# Patient Record
Sex: Female | Born: 1950 | Hispanic: Yes | Smoking: Never smoker
Health system: Southern US, Community
[De-identification: ages and names within clinical notes are randomized; demographics above are authoritative.]

## PROBLEM LIST (undated history)

## (undated) DIAGNOSIS — E039 Hypothyroidism, unspecified: Secondary | ICD-10-CM

## (undated) DIAGNOSIS — Z800 Family history of malignant neoplasm of digestive organs: Secondary | ICD-10-CM

## (undated) DIAGNOSIS — E079 Disorder of thyroid, unspecified: Secondary | ICD-10-CM

## (undated) DIAGNOSIS — M8580 Other specified disorders of bone density and structure, unspecified site: Secondary | ICD-10-CM

## (undated) DIAGNOSIS — R32 Unspecified urinary incontinence: Secondary | ICD-10-CM

## (undated) HISTORY — DX: Family history of malignant neoplasm of digestive organs: Z80.0

## (undated) HISTORY — PX: CATARACT EXTRACTION: SUR2

## (undated) HISTORY — DX: Other specified disorders of bone density and structure, unspecified site: M85.80

## (undated) HISTORY — DX: Unspecified urinary incontinence: R32

## (undated) HISTORY — PX: THORACIC OUTLET SURGERY: SHX2502

## (undated) HISTORY — DX: Disorder of thyroid, unspecified: E07.9

---

## 1999-08-03 ENCOUNTER — Other Ambulatory Visit: Admission: RE | Admit: 1999-08-03 | Discharge: 1999-08-03 | Payer: Self-pay | Admitting: Obstetrics and Gynecology

## 2000-10-18 ENCOUNTER — Other Ambulatory Visit: Admission: RE | Admit: 2000-10-18 | Discharge: 2000-10-18 | Payer: Self-pay | Admitting: Obstetrics and Gynecology

## 2000-10-19 ENCOUNTER — Other Ambulatory Visit: Admission: RE | Admit: 2000-10-19 | Discharge: 2000-10-19 | Payer: Self-pay | Admitting: Obstetrics and Gynecology

## 2000-10-19 ENCOUNTER — Encounter (INDEPENDENT_AMBULATORY_CARE_PROVIDER_SITE_OTHER): Payer: Self-pay

## 2001-11-05 ENCOUNTER — Other Ambulatory Visit: Admission: RE | Admit: 2001-11-05 | Discharge: 2001-11-05 | Payer: Self-pay | Admitting: Obstetrics and Gynecology

## 2003-03-14 ENCOUNTER — Other Ambulatory Visit: Admission: RE | Admit: 2003-03-14 | Discharge: 2003-03-14 | Payer: Self-pay | Admitting: Obstetrics and Gynecology

## 2004-03-25 ENCOUNTER — Other Ambulatory Visit: Admission: RE | Admit: 2004-03-25 | Discharge: 2004-03-25 | Payer: Self-pay | Admitting: Obstetrics and Gynecology

## 2005-04-11 ENCOUNTER — Other Ambulatory Visit: Admission: RE | Admit: 2005-04-11 | Discharge: 2005-04-11 | Payer: Self-pay | Admitting: Obstetrics and Gynecology

## 2007-12-10 ENCOUNTER — Encounter: Admission: RE | Admit: 2007-12-10 | Discharge: 2007-12-10 | Payer: Self-pay | Admitting: Geriatric Medicine

## 2008-01-17 ENCOUNTER — Encounter: Admission: RE | Admit: 2008-01-17 | Discharge: 2008-02-08 | Payer: Self-pay | Admitting: Geriatric Medicine

## 2008-09-16 ENCOUNTER — Encounter: Admission: RE | Admit: 2008-09-16 | Discharge: 2008-09-16 | Payer: Self-pay | Admitting: Geriatric Medicine

## 2014-02-05 ENCOUNTER — Ambulatory Visit
Admission: RE | Admit: 2014-02-05 | Discharge: 2014-02-05 | Disposition: A | Discharge status: Home / Self Care | Payer: BC Managed Care – PPO | Source: Ambulatory Visit | Attending: Chiropractic Medicine | Admitting: Chiropractic Medicine

## 2014-02-05 ENCOUNTER — Other Ambulatory Visit: Payer: Self-pay | Admitting: Chiropractic Medicine

## 2014-02-05 DIAGNOSIS — M545 Low back pain, unspecified: Secondary | ICD-10-CM

## 2014-04-24 ENCOUNTER — Other Ambulatory Visit: Payer: Self-pay | Admitting: Obstetrics and Gynecology

## 2014-05-13 ENCOUNTER — Telehealth: Payer: Self-pay | Admitting: Obstetrics and Gynecology

## 2014-05-13 NOTE — Telephone Encounter (Signed)
Call from [place] at [facility] and Dr Harrington Challenger is available for this date and time to assist. Patient scheduled to see Dr Quincy Simmonds on 05-30-14 and surgery to be scheduled after appointment.  Routing to provider for final review. Patient agreeable to disposition. Will close encounter

## 2014-05-13 NOTE — Telephone Encounter (Signed)
Patient calling to request we coordinate with Dr. Harrington Challenger and schedule surgery prior to her ngyn appointment with Dr. Quincy Simmonds 05/30/14. 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 Referral notes are in [facility] office.

## 2014-05-13 NOTE — Telephone Encounter (Signed)
I agree to have our office call Dr. Harrington Challenger to see about his availability for surgery assisting for his patient.   Thank you!

## 2014-05-13 NOTE — Telephone Encounter (Signed)
Please make a note in the surgery book to advise Dr. Harrington Challenger of the start time for the case after we confirm the procedure and date following the patient's visit with me.

## 2014-05-13 NOTE — Telephone Encounter (Signed)
LM on Dr Harrington Challenger assistant VM to check on availability for surgery on 07-01-14. LMTCB.

## 2014-05-13 NOTE — Telephone Encounter (Signed)
Call to patient, advised that Dr Harrington Challenger and Dr Quincy Simmonds have discussed her case and we are "holding" 07-01-14 as a potential surgery date for patient. We will have to check with Dr Harrington Challenger on availability for this date.All pending her appointment with Dr Quincy Simmonds on 07-14-20 and she is certainly not obligated to the surgery or date. Patient very pleased.  Do you want me to call to Dr Harrington Challenger to se if this date will work for him?

## 2014-05-21 ENCOUNTER — Encounter (HOSPITAL_COMMUNITY): Admission: RE | Payer: Self-pay | Source: Ambulatory Visit

## 2014-05-21 ENCOUNTER — Ambulatory Visit (HOSPITAL_COMMUNITY)
Admission: RE | Admit: 2014-05-21 | Payer: BC Managed Care – PPO | Source: Ambulatory Visit | Admitting: Obstetrics and Gynecology

## 2014-05-21 SURGERY — HYSTERECTOMY, VAGINAL
Anesthesia: Choice

## 2014-05-30 ENCOUNTER — Encounter: Payer: Self-pay | Admitting: Obstetrics and Gynecology

## 2014-05-30 ENCOUNTER — Ambulatory Visit (INDEPENDENT_AMBULATORY_CARE_PROVIDER_SITE_OTHER): Payer: BC Managed Care – PPO | Admitting: Obstetrics and Gynecology

## 2014-05-30 VITALS — BP 110/62 | HR 60 | Resp 16 | Ht 62.75 in | Wt 125.4 lb

## 2014-05-30 DIAGNOSIS — N814 Uterovaginal prolapse, unspecified: Secondary | ICD-10-CM

## 2014-05-30 DIAGNOSIS — N393 Stress incontinence (female) (male): Secondary | ICD-10-CM

## 2014-05-30 DIAGNOSIS — N812 Incomplete uterovaginal prolapse: Secondary | ICD-10-CM

## 2014-05-30 LAB — POCT URINALYSIS DIPSTICK
Bilirubin, UA: NEGATIVE
Blood, UA: NEGATIVE
Glucose, UA: NEGATIVE
Ketones, UA: NEGATIVE
LEUKOCYTES UA: NEGATIVE
Nitrite, UA: NEGATIVE
Protein, UA: NEGATIVE
Urobilinogen, UA: NEGATIVE
pH, UA: 5

## 2014-05-30 NOTE — Progress Notes (Signed)
Patient ID: Crystal Carr, female   DOB: [birth year], 63 y.o.   MRN: [id number] GYNECOLOGY VISIT  PCP:  Lajean Manes, MD  Referring provider:  Gus Height, MD  HPI: 63 y.o.   Single  Caucasian  female   G2P2002 with Patient's last menstrual period was 02/09/2002.   here for  Uterine prolapse.  Progressive for 10 years.  Feels soreness.  No vaginal bleeding.  Notes bladder has dropped.  Some leakage if bladder if sneezes and coughs.  No leakage if exercises. Some difficulty to finish bowel movements. Needs to evacuate twice occasionally.  No fecal soiling.  Tried a donut pessary, but this did not work well.   No urodynamic testing.   Using Estrace 1 grams pv vagina 2 times a week.   Urine:  Neg  GYNECOLOGIC HISTORY: Patient's last menstrual period was 02/09/2002. Sexually active:  no Partner preference: female Contraception: postmenopausal   Menopausal hormone therapy: Estrace vaginal cream DES exposure:   no Blood transfusions:  no  Sexually transmitted diseases: no   GYN procedures and prior surgeries:  no Last mammogram:  03/2014 [facility]               Last pap and high risk HPV testing:  03/2014 WUJ:WJXBJY of HPV testing  History of abnormal pap smear: 2008 hx of dysplasia and colposcopy but no treatment to cervix.  Repeat pap normal.    OB History   Grav Para Term Preterm Abortions TAB SAB Ect Mult Living   2 2 2       2        LIFESTYLE: Exercise:  Aerobics and weight bearing         Tobacco:  no Alcohol:    1 drink per week Drug use:  no  There are no active problems to display for this patient.   Past Medical History  Diagnosis Date  . Osteopenia   . Thyroid disease     Hyperthyroid--had iodine therapy  . Urinary incontinence     with sneezing, coughing    Past Surgical History  Procedure Laterality Date  . Cataract extraction Bilateral   . Thoracic outlet surgery      Current Outpatient Prescriptions  Medication Sig Dispense  Refill  . calcium carbonate 1250 MG capsule Take 1,250 mg by mouth 2 (two) times daily with a meal.      . cetirizine (ZYRTEC) 10 MG tablet Take 10 mg by mouth as needed for allergies.      . Cholecalciferol (VITAMIN D-3) 1000 UNITS CAPS Take 1,000 Units by mouth daily.      . Fish Oil-Cholecalciferol (FISH OIL + D3) 1000-1000 MG-UNIT CAPS Take 1,000 mg by mouth daily.      Marland Kitchen desonide (DESOWEN) 0.05 % ointment Apply 1 application topically as needed.      Marland Kitchen ELIDEL 1 % cream Apply 1 application topically as needed.      Marland Kitchen ESTRACE VAGINAL 0.1 MG/GM vaginal cream Place 0.1 g vaginally 2 (two) times a week.      . levothyroxine (SYNTHROID, LEVOTHROID) 25 MCG tablet Take 25 mcg by mouth daily.      Marland Kitchen VANOS 0.1 % CREA Apply 1 application topically as needed.       No current facility-administered medications for this visit.     ALLERGIES: Cefzil  Family History  Problem Relation Age of Onset  . Pancreatic cancer Mother     History   Social History  . Marital Status: Single  Spouse Name: N/A    Number of Children: N/A  . Years of Education: N/A   Occupational History  . Not on file.   Social History Main Topics  . Smoking status: Never Smoker   . Smokeless tobacco: Not on file  . Alcohol Use: 0.5 oz/week    1 drink(s) per week  . Drug Use: No  . Sexual Activity: Not Currently    Partners: Male    Birth Control/ Protection: Post-menopausal   Other Topics Concern  . Not on file   Social History Narrative  . No narrative on file    ROS:  Pertinent items are noted in HPI.  PHYSICAL EXAMINATION:    BP 110/62  Pulse 60  Resp 16  Ht 5' 2.75" (1.594 m)  Wt 125 lb 6.4 oz (56.881 kg)  BMI 22.39 kg/m2  LMP 02/09/2002   Wt Readings from Last 3 Encounters:  05/30/14 125 lb 6.4 oz (56.881 kg)     Ht Readings from Last 3 Encounters:  05/30/14 5' 2.75" (1.594 m)    General appearance: alert, cooperative and appears stated age Head: Normocephalic, without obvious  abnormality, atraumatic Neck: no adenopathy, supple, symmetrical, trachea midline and thyroid not enlarged, symmetric, no tenderness/mass/nodules Lungs: clear to auscultation bilaterally. Heart: regular rate and rhythm Abdomen: soft, non-tender; no masses,  no organomegaly Extremities: extremities normal, atraumatic, no cyanosis or edema Skin: Skin color, texture, turgor normal. No rashes or lesions Lymph nodes: Cervical, supraclavicular, and axillary nodes normal. No abnormal inguinal nodes palpated Neurologic: Grossly normal  Pelvic: External genitalia:  no lesions              Urethra:  normal appearing urethra with no masses, tenderness or lesions              Bartholins and Skenes: normal                 Vagina: normal appearing vagina with normal color and discharge, no lesions, second degree cystocele, first degree uterine prolapse, almost second degree rectocele.              Cervix: normal appearance                 Bimanual Exam:  Uterus:  uterus is normal size, shape, consistency and nontender                                      Adnexa: normal adnexa in size, nontender and no masses                                      Rectovaginal: Confirms                                      Anus:  normal sphincter tone, no lesions  ASSESSMENT  Incomplete uterovaginal prolapse. Genuine stress incontinence.   PLAN  I have had a comprehensive discussion with the patient regarding prolapse and urinary incontinence.  I have provided reading materials from [employer]COG regarding prolapse and incontinence in general as well as medical and surgical treatment for these conditions. Medical treatments may include physical therapy and pessary use.  We discussed multiple routes of approach to surgery including: - abdominal hysterectomy with abdominal sacrocolpopexy  with permanent graft with an anterior and posterior colporrhaphy and TVT Exact mdurethral sling. - vaginal approach with vaginal hysterectomy  and anterior and posterior colporrhaphy with possible sacrospinous fixation using native tissue repair or augmentation with bovine or porcine graft, and TVT midurethral sling and cystoscopy.   We discussed benefits and risks of surgery which include but are not limited to bleeding, infection, damage to surrounding organs, ureteral damage, vaginal pain with intercourse, mesh erosion and exposure, dyspareunia, urinary retention and need for prolonged catheterization and/or self catheterization, reoperation, recurrence of prolapse and incontinence,  DVT, PE, death, and reaction to anesthesia.    I have discussed surgical expectations regarding the procedures and success rates, outcomes, and recovery.     With the patient's current level of prolapse and desire for a minimally invasive approach, I would favor a TVH with anterior and posterior colporrhaphy and TVT midurethral sling/cystoscopy.   She will need to complete urodynamic testing with a pessary to reduce prolapse.   An After Visit Summary was printed and given to the patient.  60 minutes of face to face time of which over 50% was spent in counseling.

## 2014-06-01 DIAGNOSIS — N393 Stress incontinence (female) (male): Secondary | ICD-10-CM | POA: Insufficient documentation

## 2014-06-01 DIAGNOSIS — N812 Incomplete uterovaginal prolapse: Secondary | ICD-10-CM | POA: Insufficient documentation

## 2014-06-03 ENCOUNTER — Telehealth: Payer: Self-pay | Admitting: Obstetrics and Gynecology

## 2014-06-03 NOTE — Telephone Encounter (Signed)
Routing to [place] for Urodynamics scheduling.

## 2014-06-03 NOTE — Telephone Encounter (Signed)
Patient is calling to schedule her urodynamics.

## 2014-06-09 ENCOUNTER — Telehealth: Payer: Self-pay | Admitting: Emergency Medicine

## 2014-06-09 NOTE — Telephone Encounter (Signed)
Spoke with patient and instructions for procedure given.  She is scheduled for UA on 06/12/14, Urodynamics on 06/18/14. Patient not currently on bladder control medications.   Dr. Quincy Simmonds,  For scheduling follow up, how would you like to proceed? Urodynamics on 7/8 and surgery with you on 07/01/14.

## 2014-06-09 NOTE — Telephone Encounter (Signed)
Message left to return call to [place] at [phone].   Needs Urodynamics instructions.  Needs follow up with Dr. Quincy Simmonds scheduled.

## 2014-06-09 NOTE — Telephone Encounter (Signed)
If I have an opening in my schedule to perform a consultation on 06/18/14, I will do that. If the schedule does not allow, I will do the visit on 06/30/14.  Thanks!

## 2014-06-12 ENCOUNTER — Ambulatory Visit: Payer: BC Managed Care – PPO

## 2014-06-12 ENCOUNTER — Ambulatory Visit (INDEPENDENT_AMBULATORY_CARE_PROVIDER_SITE_OTHER): Payer: BC Managed Care – PPO | Admitting: *Deleted

## 2014-06-12 VITALS — BP 98/58 | Resp 14 | Ht 62.75 in | Wt 127.0 lb

## 2014-06-12 DIAGNOSIS — N393 Stress incontinence (female) (male): Secondary | ICD-10-CM

## 2014-06-12 LAB — POCT URINALYSIS DIPSTICK
Leukocytes, UA: NEGATIVE
Urobilinogen, UA: NEGATIVE
pH, UA: 5

## 2014-06-12 NOTE — Progress Notes (Signed)
Patient is here for urinalysis of urine for Urodynamics which is scheduled for 06/18/14 with Dr. Quincy Simmonds.  Urine specimen obtained and dipped for urinalysis  Result: Negative  Patient aware that she is good to go for appointment Wednesday.   Routed to provider for review, encounter closed.

## 2014-06-12 NOTE — Telephone Encounter (Signed)
Patient here in office today to complete UA prior to urodynamics. Scheduled at time of making appointment for urodynamics by [place].  UA results in Epic.  Patient is scheduled for follow up urodynamics consult with Dr. Quincy Simmonds for 7/8 at 1345. She is agreeable to this. This time per Lamont Snowball, RN.  Routing to provider for final review.   Will close encounter.

## 2014-06-16 NOTE — Telephone Encounter (Signed)
Spoke with patient. Advised that patient liability under new coverage would be PR: $5830.94. Payment will be due 07/07. Patient agreeable.  Patient will make payment at pre-op visit 07/08.

## 2014-06-17 ENCOUNTER — Encounter (HOSPITAL_COMMUNITY): Payer: Self-pay | Admitting: Pharmacist

## 2014-06-17 NOTE — Progress Notes (Signed)
Encounter reviewed by Dr. Josalin Carneiro Silva.  

## 2014-06-18 ENCOUNTER — Ambulatory Visit (INDEPENDENT_AMBULATORY_CARE_PROVIDER_SITE_OTHER): Payer: BC Managed Care – PPO | Admitting: Obstetrics and Gynecology

## 2014-06-18 ENCOUNTER — Encounter: Payer: Self-pay | Admitting: Obstetrics and Gynecology

## 2014-06-18 VITALS — BP 116/68 | HR 68 | Ht 62.5 in | Wt 125.0 lb

## 2014-06-18 VITALS — BP 108/60 | HR 68 | Ht 62.5 in | Wt 125.0 lb

## 2014-06-18 DIAGNOSIS — N812 Incomplete uterovaginal prolapse: Secondary | ICD-10-CM

## 2014-06-18 DIAGNOSIS — N393 Stress incontinence (female) (male): Secondary | ICD-10-CM

## 2014-06-18 NOTE — Progress Notes (Signed)
GYNECOLOGY  VISIT   HPI: 63 y.o.   Single  Caucasian  female   G2P2002 with Patient's last menstrual period was 02/09/2002.   here for   Surgery Consultation. Patient referred by Dr. Melinda Crutch, her primary gynecologist.   Patient desires to proceed with surgery for prolapse of uterus and vagina.  Pessary not successful.  Can leak urine with sneeze or cough if bladder is full.  Urodynamics testing performed today with pessary to reduce prolapse.  Uroflow - continuous pattern.  Voided volume 172 cc.  CMG - stable.  Sensations:  345 cc, 541 cc, 777 cc. LPP 72 cm H20. UCP - 38 cm H20.  GYNECOLOGIC HISTORY:  Patient's last menstrual period was 02/09/2002.  Sexually active: no  Partner preference: female  Contraception: postmenopausal  Menopausal hormone therapy: Estrace vaginal cream  DES exposure: no  Blood transfusions: no  Sexually transmitted diseases: no  GYN procedures and prior surgeries: no  Last mamm[facility]: 03/2014 [facility]  Last pap and high risk HPV testing: 03/2014 GEX:BMWUXL of HPV testing  History of abnormal pap smear: 2008 hx of dysplasia and colposcopy but no treatment to cervix. Repeat pap normal.   OB History    Grav  Para  Term  Preterm  Abortions  TAB  SAB  Ect  Mult  Living    2  2  2        2      LIFESTYLE:  Exercise: Aerobics and weight bearing  Tobacco: no  Alcohol: 1 drink per week  Drug use: no          Patient Active Problem List   Diagnosis Date Noted  . Genuine stress incontinence, female 06/01/2014  . Uterovaginal prolapse, incomplete 06/01/2014    Past Medical History  Diagnosis Date  . Osteopenia   . Thyroid disease     Hyperthyroid--had iodine therapy  . Urinary incontinence     with sneezing, coughing    Past Surgical History  Procedure Laterality Date  . Cataract extraction Bilateral   . Thoracic outlet surgery      Current Outpatient Prescriptions  Medication Sig Dispense Refill  . calcium carbonate 1250 MG  capsule Take 600 mg by mouth daily.       . Cetirizine HCl (ZYRTEC ALLERGY PO) Take by mouth as needed.      . Cholecalciferol (VITAMIN D-3) 1000 UNITS CAPS Take 1,000 Units by mouth daily.       Marland Kitchen desonide (DESOWEN) 0.05 % ointment Apply 1 application topically as needed.      Marland Kitchen ELIDEL 1 % cream Apply 1 application topically as needed.      Marland Kitchen ESTRACE VAGINAL 0.1 MG/GM vaginal cream Place 1 g vaginally 2 (two) times a week.       . Fish Oil-Cholecalciferol (FISH OIL + D3) 1000-1000 MG-UNIT CAPS Take 1,000 mg by mouth daily.      Marland Kitchen levothyroxine (SYNTHROID, LEVOTHROID) 25 MCG tablet Take 25 mcg by mouth daily.      . Multiple Vitamin (MULTIVITAMIN WITH MINERALS) TABS tablet Take 1 tablet by mouth daily.      Marland Kitchen VANOS 0.1 % CREA Apply 1 application topically as needed.       No current facility-administered medications for this visit.     ALLERGIES: Almond oil; Cefzil; and Fig extract  Family History  Problem Relation Age of Onset  . Pancreatic cancer Mother     History   Social History  . Marital Status: Single  Spouse Name: N/A    Number of Children: N/A  . Years of Education: N/A   Occupational History  . Not on file.   Social History Main Topics  . Smoking status: Never Smoker   . Smokeless tobacco: Not on file  . Alcohol Use: 0.5 oz/week    1 drink(s) per week  . Drug Use: No  . Sexual Activity: Not Currently    Partners: Male    Birth Control/ Protection: Post-menopausal   Other Topics Concern  . Not on file   Social History Narrative  . No narrative on file    ROS:  Pertinent items are noted in HPI.  PHYSICAL EXAMINATION:    BP 108/60  Pulse 68  Ht 5' 2.5" (1.588 m)  Wt 125 lb (56.7 kg)  BMI 22.48 kg/m2  LMP 02/09/2002     General appearance: alert, cooperative and appears stated age Lungs: clear to auscultation bilaterally Heart: regular rate and rhythm Abdomen: soft, non-tender; no masses,  no organomegaly No abnormal inguinal nodes  palpated  Pelvic: External genitalia:  no lesions              Urethra:  normal appearing urethra with no masses, tenderness or lesions              Bartholins and Skenes: normal   Vagina: normal appearing vagina with normal color and discharge, no lesions, second degree cystocele, first degree uterine prolapse, almost second degree rectocele.               Vagina: normal appearing vagina with normal color and discharge, no lesions.               Cervix: normal appearance                   Bimanual Exam:  Uterus:  uterus is normal size, shape, consistency and nontender                                      Adnexa: normal adnexa in size, nontender and no masses                                      Rectovaginal: Confirms                                      Anus:  normal sphincter tone, no lesions  ASSESSMENT  Incomplete uterovaginal prolapse.  Genuine stress incontinence.   PLAN  Total vaginal hysterectomy with anterior and posterior colporrhaphy, TVT Exact midurethral sling and cystoscopy.  Risks, benefits, and alternatives discussed with the patient who wishes to proceed.  Counseled on bilateral salpingo-oophorectomy risks and benefits.  I educated the patient that the only way to guarantee removal of these organs is to proceed with a laparoscopic approach, meaning an LAVH/BSO. She will discuss with her husband and call the office back if she desires to do this.   Dr. Melinda Crutch will be assisting this procedure.   An After Visit Summary was printed and given to the patient.  __40____ minutes face to face time of which over 50% was spent in counseling.

## 2014-06-18 NOTE — Progress Notes (Signed)
Urodynamic testing performed. #2 pessary ring with support used during procedure. PVR noted to be 20 cc.  Urinary leaking noted with patient in standing position at third sensation and pessary removed. Patient tolerated procedure well.

## 2014-06-19 ENCOUNTER — Telehealth: Payer: Self-pay | Admitting: *Deleted

## 2014-06-19 NOTE — Telephone Encounter (Signed)
Thank you for the update.  I will close the encounter.

## 2014-06-19 NOTE — Telephone Encounter (Signed)
Patient calling in with decision on removal of tubes and ovaries.  She states she has decided she does NOT want to remove tubes and ovaries.  She sis ask my personal opinion and I advised her that it was most important that she feel comfortable with her decision. There may be less benefit to her to keep ovaries since she is menopausal but that is totally up to her and comfort with her decision.  Patient states she is most comfortable with plan to keep ovaries and tubes. Advised I will let Dr Quincy Simmonds know her decision.

## 2014-06-19 NOTE — Progress Notes (Signed)
Encounter reviewed by Dr. Josefa Half.  Urodynamics reviewed and analysis is in separate report from 06/18/14.

## 2014-06-24 ENCOUNTER — Encounter (HOSPITAL_COMMUNITY): Payer: Self-pay

## 2014-06-24 ENCOUNTER — Encounter (HOSPITAL_COMMUNITY)
Admission: RE | Admit: 2014-06-24 | Discharge: 2014-06-24 | Disposition: A | Discharge status: Home / Self Care | Payer: BC Managed Care – PPO | Source: Ambulatory Visit | Attending: Obstetrics and Gynecology | Admitting: Obstetrics and Gynecology

## 2014-06-24 DIAGNOSIS — Z01812 Encounter for preprocedural laboratory examination: Secondary | ICD-10-CM | POA: Insufficient documentation

## 2014-06-24 DIAGNOSIS — Z01818 Encounter for other preprocedural examination: Secondary | ICD-10-CM | POA: Insufficient documentation

## 2014-06-24 HISTORY — DX: Hypothyroidism, unspecified: E03.9

## 2014-06-24 LAB — CBC
HEMATOCRIT: 37 % (ref 36.0–46.0)
Hemoglobin: 12.5 g/dL (ref 12.0–15.0)
MCH: 30.2 pg (ref 26.0–34.0)
MCHC: 33.8 g/dL (ref 30.0–36.0)
MCV: 89.4 fL (ref 78.0–100.0)
PLATELETS: 202 10*3/uL (ref 150–400)
RBC: 4.14 MIL/uL (ref 3.87–5.11)
RDW: 13 % (ref 11.5–15.5)
WBC: 6.4 10*3/uL (ref 4.0–10.5)

## 2014-06-24 NOTE — Patient Instructions (Signed)
[place]  06/24/2014   Your procedure is scheduled on:  07/01/14  Enter through the Main Entrance of [facility] up the phone at the desk and dial [phone].   Call this number if you have problems the morning of surgery: [phone]   Remember:   Do not eat food:After Midnight.  Do not drink clear liquids: After Midnight.  Take these medicines the morning of surgery with A SIP OF WATER: NA   Do not wear jewelry, make-up or nail polish.  Do not wear lotions, powders, or perfumes. You may wear deodorant.  Do not shave 48 hours prior to surgery.  Do not bring valuables to the hospital.  [facility] is not   responsible for any belongings or valuables brought to the hospital.  Contacts, dentures or bridgework may not be worn into surgery.  Leave suitcase in the car. After surgery it may be brought to your room.  For patients admitted to the hospital, checkout time is 11:00 AM the day of              discharge.   Patients discharged the day of surgery will not be allowed to drive             home.  Name and phone number of your driver: NA  Special Instructions:      Please read over the following fact sheets that you were given:   Surgical Site Infection Prevention

## 2014-06-30 MED ORDER — GENTAMICIN SULFATE 40 MG/ML IJ SOLN: INTRAMUSCULAR | Status: DC

## 2014-06-30 NOTE — H&P (Signed)
Crystal Weintraub E Amundson de Berton Lan, MD at 06/18/2014  2:02 PM      Status: Signed            GYNECOLOGY  VISIT   HPI: 63 y.o.   Single  Caucasian  female    G2P2002 with Patient's last menstrual period was 02/09/2002.    here for   Surgery Consultation. Patient referred by Dr. Melinda Crutch, her primary gynecologist.    Patient desires to proceed with surgery for prolapse of uterus and vagina.   Pessary not successful.   Can leak urine with sneeze or cough if bladder is full.   Urodynamics testing performed today with pessary to reduce prolapse.   Uroflow - continuous pattern.  Voided volume 172 cc.   CMG - stable.  Sensations:  345 cc, 541 cc, 777 cc. LPP 72 cm H20. UCP - 38 cm H20.   GYNECOLOGIC HISTORY:   Patient's last menstrual period was 02/09/2002.   Sexually active: no   Partner preference: female   Contraception: postmenopausal   Menopausal hormone therapy: Estrace vaginal cream   DES exposure: no   Blood transfusions: no   Sexually transmitted diseases: no   GYN procedures and prior surgeries: no   Last mammogram: 03/2014 [facility]   Last pap and high risk HPV testing: 03/2014 [facility] of HPV testing   History of abnormal pap smear: 2008 hx of dysplasia and colposcopy but no treatment to cervix. Repeat pap normal.     OB History      Grav   Para   Term   Preterm   Abortions   TAB   SAB   Ect   Mult   Living      2   2   2               2         LIFESTYLE:   Exercise: Aerobics and weight bearing   Tobacco: no   Alcohol: 1 drink per week   Drug use: no             Patient Active Problem List     Diagnosis  Date Noted   .  Genuine stress incontinence, female  06/01/2014   .  Uterovaginal prolapse, incomplete  06/01/2014         Past Medical History   Diagnosis  Date   .  Osteopenia     .  Thyroid disease         Hyperthyroid--had iodine therapy   .  Urinary incontinence         with sneezing, coughing         Past Surgical History    Procedure  Laterality  Date   .  Cataract extraction  Bilateral     .  Thoracic outlet surgery             Current Outpatient Prescriptions   Medication  Sig  Dispense  Refill   .  calcium carbonate 1250 MG capsule  Take 600 mg by mouth daily.          .  Cetirizine HCl (ZYRTEC ALLERGY PO)  Take by mouth as needed.         .  Cholecalciferol (VITAMIN D-3) 1000 UNITS CAPS  Take 1,000 Units by mouth daily.          Marland Kitchen  desonide (DESOWEN) 0.05 % ointment  Apply 1 application topically as needed.         Marland Kitchen  ELIDEL 1 % cream  Apply 1 application topically as needed.         Marland Kitchen  ESTRACE VAGINAL 0.1 MG/GM vaginal cream  Place 1 g vaginally 2 (two) times a week.          .  Fish Oil-Cholecalciferol (FISH OIL + D3) 1000-1000 MG-UNIT CAPS  Take 1,000 mg by mouth daily.         Marland Kitchen  levothyroxine (SYNTHROID, LEVOTHROID) 25 MCG tablet  Take 25 mcg by mouth daily.         .  Multiple Vitamin (MULTIVITAMIN WITH MINERALS) TABS tablet  Take 1 tablet by mouth daily.         Marland Kitchen  VANOS 0.1 % CREA  Apply 1 application topically as needed.             No current facility-administered medications for this visit.        ALLERGIES: Almond oil; Cefzil; and Fig extract    Family History   Problem  Relation  Age of Onset   .  Pancreatic cancer  Mother           History       Social History   .  Marital Status:  Single       Spouse Name:  N/A       Number of Children:  N/A   .  Years of Education:  N/A       Occupational History   .  Not on file.       Social History Main Topics   .  Smoking status:  Never Smoker    .  Smokeless tobacco:  Not on file   .  Alcohol Use:  0.5 oz/week       1 drink(s) per week   .  Drug Use:  No   .  Sexual Activity:  Not Currently       Partners:  Male       Birth Control/ Protection:  Post-menopausal       Other Topics  Concern   .  Not on file       Social History Narrative   .  No narrative on file        ROS:  Pertinent items are noted in  HPI.   PHYSICAL EXAMINATION:     BP 108/60  Pulse 68  Ht 5' 2.5" (1.588 m)  Wt 125 lb (56.7 kg)  BMI 22.48 kg/m2  LMP 02/09/2002      General appearance: alert, cooperative and appears stated age Lungs: clear to auscultation bilaterally Heart: regular rate and rhythm Abdomen: soft, non-tender; no masses,  no organomegaly No abnormal inguinal nodes palpated   Pelvic: External genitalia:  no lesions              Urethra:  normal appearing urethra with no masses, tenderness or lesions              Bartholins and Skenes: normal   Vagina: normal appearing vagina with normal color and discharge, no lesions, second degree cystocele, first degree uterine prolapse, almost second degree rectocele.                Vagina: normal appearing vagina with normal color and discharge, no lesions.                Cervix: normal appearance                    Bimanual  Exam:  Uterus:  uterus is normal size, shape, consistency and nontender                                      Adnexa: normal adnexa in size, nontender and no masses                                      Rectovaginal: Confirms                                      Anus:  normal sphincter tone, no lesions   ASSESSMENT   Incomplete uterovaginal prolapse.   Genuine stress incontinence.    PLAN   Total vaginal hysterectomy with anterior and posterior colporrhaphy, TVT Exact midurethral sling and cystoscopy.  Risks, benefits, and alternatives discussed with the patient who wishes to proceed.   Counseled on bilateral salpingo-oophorectomy risks and benefits.  I educated the patient that the only way to guarantee removal of these organs is to proceed with a laparoscopic approach, meaning an LAVH/BSO. She will discuss with her husband and call the office back if she desires to do this.    Dr. Melinda Crutch will be assisting this procedure.    An After Visit Summary was printed and given to the patient.   __40____ minutes face to face  time of which over 50% was spent in counseling.

## 2014-07-01 ENCOUNTER — Ambulatory Visit (HOSPITAL_COMMUNITY): Payer: BC Managed Care – PPO | Admitting: Anesthesiology

## 2014-07-01 ENCOUNTER — Observation Stay (HOSPITAL_COMMUNITY)
Admission: RE | Admit: 2014-07-01 | Discharge: 2014-07-02 | Disposition: A | Discharge status: Home / Self Care | Payer: BC Managed Care – PPO | Source: Ambulatory Visit | Attending: Obstetrics and Gynecology | Admitting: Obstetrics and Gynecology

## 2014-07-01 ENCOUNTER — Encounter (HOSPITAL_COMMUNITY): Payer: Self-pay | Admitting: *Deleted

## 2014-07-01 ENCOUNTER — Encounter (HOSPITAL_COMMUNITY): Payer: BC Managed Care – PPO | Admitting: Anesthesiology

## 2014-07-01 ENCOUNTER — Encounter (HOSPITAL_COMMUNITY)
Admission: RE | Disposition: A | Discharge status: Home / Self Care | Payer: Self-pay | Source: Ambulatory Visit | Attending: Obstetrics and Gynecology

## 2014-07-01 DIAGNOSIS — D251 Intramural leiomyoma of uterus: Secondary | ICD-10-CM | POA: Insufficient documentation

## 2014-07-01 DIAGNOSIS — N812 Incomplete uterovaginal prolapse: Principal | ICD-10-CM | POA: Insufficient documentation

## 2014-07-01 DIAGNOSIS — Z90710 Acquired absence of both cervix and uterus: Secondary | ICD-10-CM | POA: Diagnosis present

## 2014-07-01 DIAGNOSIS — N393 Stress incontinence (female) (male): Secondary | ICD-10-CM

## 2014-07-01 DIAGNOSIS — M899 Disorder of bone, unspecified: Secondary | ICD-10-CM | POA: Insufficient documentation

## 2014-07-01 DIAGNOSIS — D649 Anemia, unspecified: Secondary | ICD-10-CM | POA: Insufficient documentation

## 2014-07-01 DIAGNOSIS — R339 Retention of urine, unspecified: Secondary | ICD-10-CM | POA: Insufficient documentation

## 2014-07-01 DIAGNOSIS — M949 Disorder of cartilage, unspecified: Secondary | ICD-10-CM

## 2014-07-01 DIAGNOSIS — E0590 Thyrotoxicosis, unspecified without thyrotoxic crisis or storm: Secondary | ICD-10-CM | POA: Insufficient documentation

## 2014-07-01 HISTORY — PX: CYSTOSCOPY: SHX5120

## 2014-07-01 HISTORY — PX: VAGINAL HYSTERECTOMY: SHX2639

## 2014-07-01 HISTORY — PX: BLADDER SUSPENSION: SHX72

## 2014-07-01 LAB — BASIC METABOLIC PANEL
ANION GAP: 10 (ref 5–15)
BUN: 16 mg/dL (ref 6–23)
CO2: 28 mEq/L (ref 19–32)
CREATININE: 0.68 mg/dL (ref 0.50–1.10)
Calcium: 9.5 mg/dL (ref 8.4–10.5)
Chloride: 101 mEq/L (ref 96–112)
Glucose, Bld: 95 mg/dL (ref 70–99)
Potassium: 4.1 mEq/L (ref 3.7–5.3)
Sodium: 139 mEq/L (ref 137–147)

## 2014-07-01 SURGERY — HYSTERECTOMY, VAGINAL
Anesthesia: General | Site: Vagina

## 2014-07-01 MED ORDER — LIDOCAINE-EPINEPHRINE 1 %-1:100000 IJ SOLN
INTRAMUSCULAR | Status: DC | PRN
Start: 2014-07-01 — End: 2014-07-01
  Administered 2014-07-01: 15 mL

## 2014-07-01 MED ORDER — MENTHOL 3 MG MT LOZG
1.0000 | LOZENGE | OROMUCOSAL | Status: DC | PRN
  Filled 2014-07-01: qty 9

## 2014-07-01 MED ORDER — HYDROMORPHONE HCL PF 1 MG/ML IJ SOLN: 0.2500 mg | INTRAMUSCULAR | Status: DC | PRN

## 2014-07-01 MED ORDER — OXYCODONE-ACETAMINOPHEN 5-325 MG PO TABS: 1.0000 | ORAL_TABLET | ORAL | Status: DC | PRN

## 2014-07-01 MED ORDER — NEOSTIGMINE METHYLSULFATE 10 MG/10ML IV SOLN
INTRAVENOUS | Status: AC
  Filled 2014-07-01: qty 1

## 2014-07-01 MED ORDER — KETOROLAC TROMETHAMINE 30 MG/ML IJ SOLN
INTRAMUSCULAR | Status: AC
  Filled 2014-07-01: qty 1

## 2014-07-01 MED ORDER — GENTAMICIN SULFATE 40 MG/ML IJ SOLN
INTRAMUSCULAR | Status: AC
  Administered 2014-07-01: 100 mL via INTRAVENOUS
  Filled 2014-07-01: qty 7

## 2014-07-01 MED ORDER — ESTRADIOL 0.1 MG/GM VA CREA
TOPICAL_CREAM | VAGINAL | Status: DC | PRN
  Administered 2014-07-01: 1 via VAGINAL

## 2014-07-01 MED ORDER — SODIUM CHLORIDE 0.9 % IJ SOLN: 9.0000 mL | INTRAMUSCULAR | Status: DC | PRN

## 2014-07-01 MED ORDER — KETOROLAC TROMETHAMINE 30 MG/ML IJ SOLN
INTRAMUSCULAR | Status: DC | PRN
  Administered 2014-07-01: 30 mg via INTRAVENOUS

## 2014-07-01 MED ORDER — LIDOCAINE HCL (CARDIAC) 20 MG/ML IV SOLN
INTRAVENOUS | Status: DC | PRN
  Administered 2014-07-01: 40 mg via INTRAVENOUS

## 2014-07-01 MED ORDER — MORPHINE SULFATE (PF) 1 MG/ML IV SOLN
INTRAVENOUS | Status: DC
  Administered 2014-07-01: 11:00:00 via INTRAVENOUS
  Administered 2014-07-01: 3 mg via INTRAVENOUS
  Administered 2014-07-01: 1 mg via INTRAVENOUS
  Administered 2014-07-01: 12 mg via INTRAVENOUS
  Filled 2014-07-01: qty 25

## 2014-07-01 MED ORDER — DIPHENHYDRAMINE HCL 50 MG/ML IJ SOLN: 12.5000 mg | Freq: Four times a day (QID) | INTRAMUSCULAR | Status: DC | PRN

## 2014-07-01 MED ORDER — DEXAMETHASONE SODIUM PHOSPHATE 10 MG/ML IJ SOLN
INTRAMUSCULAR | Status: DC | PRN
  Administered 2014-07-01 (×2): 5 mg via INTRAVENOUS

## 2014-07-01 MED ORDER — FENTANYL CITRATE 0.05 MG/ML IJ SOLN
INTRAMUSCULAR | Status: DC | PRN
  Administered 2014-07-01 (×5): 50 ug via INTRAVENOUS

## 2014-07-01 MED ORDER — PROPOFOL 10 MG/ML IV EMUL
INTRAVENOUS | Status: AC
  Filled 2014-07-01: qty 20

## 2014-07-01 MED ORDER — PROPOFOL 10 MG/ML IV BOLUS
INTRAVENOUS | Status: DC | PRN
  Administered 2014-07-01: 120 mg via INTRAVENOUS
  Administered 2014-07-01: 20 mg via INTRAVENOUS

## 2014-07-01 MED ORDER — GLYCOPYRROLATE 0.2 MG/ML IJ SOLN
INTRAMUSCULAR | Status: AC
  Filled 2014-07-01: qty 4

## 2014-07-01 MED ORDER — LIDOCAINE-EPINEPHRINE 1 %-1:100000 IJ SOLN
INTRAMUSCULAR | Status: AC
  Filled 2014-07-01: qty 3

## 2014-07-01 MED ORDER — LACTATED RINGERS IV SOLN
INTRAVENOUS | Status: DC
  Administered 2014-07-01 – 2014-07-02 (×3): via INTRAVENOUS

## 2014-07-01 MED ORDER — KETOROLAC TROMETHAMINE 15 MG/ML IJ SOLN
15.0000 mg | Freq: Four times a day (QID) | INTRAMUSCULAR | Status: DC
  Administered 2014-07-01: 15 mg via INTRAVENOUS
  Filled 2014-07-01 (×4): qty 1

## 2014-07-01 MED ORDER — LACTATED RINGERS IV SOLN: INTRAVENOUS | Status: DC

## 2014-07-01 MED ORDER — LIDOCAINE HCL (CARDIAC) 20 MG/ML IV SOLN
INTRAVENOUS | Status: AC
  Filled 2014-07-01: qty 5

## 2014-07-01 MED ORDER — ONDANSETRON HCL 4 MG/2ML IJ SOLN
INTRAMUSCULAR | Status: AC
  Filled 2014-07-01: qty 2

## 2014-07-01 MED ORDER — GLYCOPYRROLATE 0.2 MG/ML IJ SOLN
INTRAMUSCULAR | Status: DC | PRN
  Administered 2014-07-01: 0.4 mg via INTRAVENOUS

## 2014-07-01 MED ORDER — FENTANYL CITRATE 0.05 MG/ML IJ SOLN
INTRAMUSCULAR | Status: AC
  Filled 2014-07-01: qty 5

## 2014-07-01 MED ORDER — DOCUSATE SODIUM 100 MG PO CAPS
100.0000 mg | ORAL_CAPSULE | Freq: Two times a day (BID) | ORAL | Status: DC
  Administered 2014-07-01 – 2014-07-02 (×2): 100 mg via ORAL
  Filled 2014-07-01 (×2): qty 1

## 2014-07-01 MED ORDER — ROCURONIUM BROMIDE 100 MG/10ML IV SOLN
INTRAVENOUS | Status: DC | PRN
  Administered 2014-07-01: 10 mg via INTRAVENOUS
  Administered 2014-07-01: 30 mg via INTRAVENOUS
  Administered 2014-07-01: 10 mg via INTRAVENOUS

## 2014-07-01 MED ORDER — NEOSTIGMINE METHYLSULFATE 10 MG/10ML IV SOLN
INTRAVENOUS | Status: DC | PRN
  Administered 2014-07-01: 2 mg via INTRAVENOUS

## 2014-07-01 MED ORDER — DIPHENHYDRAMINE HCL 12.5 MG/5ML PO ELIX: 12.5000 mg | ORAL_SOLUTION | Freq: Four times a day (QID) | ORAL | Status: DC | PRN

## 2014-07-01 MED ORDER — DEXAMETHASONE SODIUM PHOSPHATE 10 MG/ML IJ SOLN
INTRAMUSCULAR | Status: AC
  Filled 2014-07-01: qty 1

## 2014-07-01 MED ORDER — STERILE WATER FOR IRRIGATION IR SOLN
Status: DC | PRN
  Administered 2014-07-01: 1000 mL

## 2014-07-01 MED ORDER — ESTRADIOL 0.1 MG/GM VA CREA
TOPICAL_CREAM | VAGINAL | Status: AC
  Filled 2014-07-01: qty 42.5

## 2014-07-01 MED ORDER — LACTATED RINGERS IV SOLN
INTRAVENOUS | Status: DC
  Administered 2014-07-01 (×2): via INTRAVENOUS
  Administered 2014-07-01: 1000 mL via INTRAVENOUS

## 2014-07-01 MED ORDER — IBUPROFEN 600 MG PO TABS
600.0000 mg | ORAL_TABLET | Freq: Four times a day (QID) | ORAL | Status: DC | PRN
  Administered 2014-07-01 – 2014-07-02 (×2): 600 mg via ORAL
  Filled 2014-07-01 (×2): qty 1

## 2014-07-01 MED ORDER — MIDAZOLAM HCL 5 MG/5ML IJ SOLN
INTRAMUSCULAR | Status: DC | PRN
  Administered 2014-07-01: 2 mg via INTRAVENOUS

## 2014-07-01 MED ORDER — ONDANSETRON HCL 4 MG/2ML IJ SOLN
4.0000 mg | Freq: Four times a day (QID) | INTRAMUSCULAR | Status: DC | PRN
  Administered 2014-07-01: 4 mg via INTRAVENOUS
  Filled 2014-07-01: qty 2

## 2014-07-01 MED ORDER — MIDAZOLAM HCL 2 MG/2ML IJ SOLN
INTRAMUSCULAR | Status: AC
  Filled 2014-07-01: qty 2

## 2014-07-01 MED ORDER — LEVOTHYROXINE SODIUM 25 MCG PO TABS
25.0000 ug | ORAL_TABLET | Freq: Every day | ORAL | Status: DC
  Filled 2014-07-01 (×2): qty 1

## 2014-07-01 MED ORDER — ONDANSETRON HCL 4 MG PO TABS: 4.0000 mg | ORAL_TABLET | Freq: Four times a day (QID) | ORAL | Status: DC | PRN

## 2014-07-01 MED ORDER — NALOXONE HCL 0.4 MG/ML IJ SOLN: 0.4000 mg | INTRAMUSCULAR | Status: DC | PRN

## 2014-07-01 SURGICAL SUPPLY — 55 items
ADH SKN CLS APL DERMABOND .7 (GAUZE/BANDAGES/DRESSINGS) ×2
BLADE 11 SAFETY STRL DISP (BLADE) ×3 IMPLANT
CANISTER SUCT 3000ML (MISCELLANEOUS) ×3 IMPLANT
CATH FOLEY 2WAY SLVR  5CC 18FR (CATHETERS) ×1
CATH FOLEY 2WAY SLVR 5CC 18FR (CATHETERS) ×2 IMPLANT
CLOTH BEACON ORANGE TIMEOUT ST (SAFETY) ×3 IMPLANT
CONT PATH 16OZ SNAP LID 3702 (MISCELLANEOUS) IMPLANT
CONTAINER PREFILL 10% NBF 60ML (FORM) IMPLANT
DECANTER SPIKE VIAL GLASS SM (MISCELLANEOUS) ×2 IMPLANT
DERMABOND ADVANCED (GAUZE/BANDAGES/DRESSINGS) ×1
DERMABOND ADVANCED .7 DNX12 (GAUZE/BANDAGES/DRESSINGS) ×2 IMPLANT
DEVICE CAPIO SLIM SINGLE (INSTRUMENTS) IMPLANT
DRAPE HYSTEROSCOPY (DRAPE) ×3 IMPLANT
DRSG TELFA 3X8 NADH (GAUZE/BANDAGES/DRESSINGS) ×3 IMPLANT
GAUZE PACKING 1 X5 YD ST (GAUZE/BANDAGES/DRESSINGS) IMPLANT
GAUZE PACKING 2X5 YD STRL (GAUZE/BANDAGES/DRESSINGS) ×3 IMPLANT
GLOVE BIO SURGEON STRL SZ 6.5 (GLOVE) ×3 IMPLANT
GLOVE BIOGEL PI IND STRL 6.5 (GLOVE) ×3 IMPLANT
GLOVE BIOGEL PI INDICATOR 6.5 (GLOVE) ×2
GOWN STRL REUS W/TWL LRG LVL3 (GOWN DISPOSABLE) ×14 IMPLANT
NDL MAYO 6 CRC TAPER PT (NEEDLE) IMPLANT
NEEDLE HYPO 22GX1.5 SAFETY (NEEDLE) ×3 IMPLANT
NEEDLE MAYO 6 CRC TAPER PT (NEEDLE) IMPLANT
NEEDLE SPNL 22GX3.5 QUINCKE BK (NEEDLE) IMPLANT
NS IRRIG 1000ML POUR BTL (IV SOLUTION) ×3 IMPLANT
PACK VAGINAL WOMENS (CUSTOM PROCEDURE TRAY) ×3 IMPLANT
PAD DRESSING TELFA 3X8 NADH (GAUZE/BANDAGES/DRESSINGS) ×2 IMPLANT
PAD MAGNETIC INST (MISCELLANEOUS) ×1 IMPLANT
PAD OB MATERNITY 4.3X12.25 (PERSONAL CARE ITEMS) ×3 IMPLANT
PLUG CATH AND CAP STER (CATHETERS) ×3 IMPLANT
SET CYSTO W/LG BORE CLAMP LF (SET/KITS/TRAYS/PACK) ×3 IMPLANT
SLING TVT EXACT (Sling) ×1 IMPLANT
SPONGE SURGIFOAM ABS GEL 12-7 (HEMOSTASIS) IMPLANT
SURGIFLO TRUKIT (HEMOSTASIS) IMPLANT
SUT CAPIO ETHIBPND (SUTURE) IMPLANT
SUT VIC AB 0 CT1 18XCR BRD8 (SUTURE) ×6 IMPLANT
SUT VIC AB 0 CT1 27 (SUTURE) ×6
SUT VIC AB 0 CT1 27XBRD ANBCTR (SUTURE) ×8 IMPLANT
SUT VIC AB 0 CT1 27XCR 8 STRN (SUTURE) IMPLANT
SUT VIC AB 0 CT1 8-18 (SUTURE) ×9
SUT VIC AB 2-0 CT1 27 (SUTURE)
SUT VIC AB 2-0 CT1 TAPERPNT 27 (SUTURE) IMPLANT
SUT VIC AB 2-0 CT2 27 (SUTURE) IMPLANT
SUT VIC AB 2-0 SH 27 (SUTURE) ×15
SUT VIC AB 2-0 SH 27XBRD (SUTURE) ×8 IMPLANT
SUT VIC AB 2-0 UR5 27 (SUTURE) IMPLANT
SUT VIC AB 2-0 UR6 27 (SUTURE) IMPLANT
SUT VICRYL 0 TIES 12 18 (SUTURE) ×3 IMPLANT
SYRINGE 10CC LL (SYRINGE) ×3 IMPLANT
TOWEL OR 17X24 6PK STRL BLUE (TOWEL DISPOSABLE) ×6 IMPLANT
TRAY FOLEY BAG SILVER LF 14FR (CATHETERS) IMPLANT
TRAY FOLEY BAG SILVER LF 16FR (CATHETERS) ×3 IMPLANT
TRAY FOLEY CATH 16FR SILVER (SET/KITS/TRAYS/PACK) ×3 IMPLANT
TUBING CONNECTING 10 (TUBING) ×3 IMPLANT
WATER STERILE IRR 1000ML POUR (IV SOLUTION) ×3 IMPLANT

## 2014-07-01 NOTE — Progress Notes (Signed)
Day of Surgery Procedure(s) (LRB): HYSTERECTOMY VAGINAL (N/A) ANTERIOR (CYSTOCELE) AND POSTERIOR REPAIR (RECTOCELE)  (N/A)  TRANSVAGINAL TAPE (TVT) PROCEDURE, (N/A) CYSTOSCOPY (N/A)  Subjective: Patient reports nausea which is mild.   Hungry and wants to [employer].  Has already walked the halls.  Good pain control.  Felt a little dizzy when was up in the halls.   Objective: I have reviewed patient's vital signs and intake and output.  General: alert Resp: clear to auscultation bilaterally Cardio: regular rate and rhythm, S1, S2 normal, no murmur, click, rub or gallop GI: Hypoactive blowel sounds, soft, nontender.  Extremities:  PAS and ted hose on.  Vaginal Bleeding: minimal Suprapubic incisions - dressings intact and dry.   Assessment: s/p Procedure(s): HYSTERECTOMY VAGINAL (N/A) ANTERIOR (CYSTOCELE) AND POSTERIOR REPAIR (RECTOCELE)  (N/A)  TRANSVAGINAL TAPE (TVT) PROCEDURE, (N/A) CYSTOSCOPY (N/A): stable Patient with low blood pressure but clinically stable.  Baseline blood pressure prior to surgery also low.   Plan: Advance diet Discontinue IV fluids Continue foley due to  bladder surgery today.   Will do bladder training tomorrow.   CBC and BMP in am. Vaginal packing out in am.  Surgical findings and procedure reviewed.  Questions answered.   LOS: 0 days    Crystal Carr 07/01/2014, 5:41 PM

## 2014-07-01 NOTE — Brief Op Note (Signed)
07/01/2014  9:48 AM  PATIENT:  Georga Hacking  63 y.o. female  PRE-OPERATIVE DIAGNOSIS:  genuine stress incontinence, incomplete uetrovaginal prolapse  POST-OPERATIVE DIAGNOSIS:  genuine stress incontinence, incomplete uetrovaginal prolapse  PROCEDURE:  Procedure(s): HYSTERECTOMY VAGINAL (N/A) ANTERIOR (CYSTOCELE) AND POSTERIOR REPAIR (RECTOCELE)  (N/A)  TRANSVAGINAL TAPE (TVT) PROCEDURE, (N/A) CYSTOSCOPY (N/A)  SURGEON:  Surgeon(s) and Role:    * Javiana Anwar E Amundson de Berton Lan, MD - Primary    * Gus Height, MD - Assisting  PHYSICIAN ASSISTANT:   ASSISTANTS:  Gus Height, MD   ANESTHESIA:   local and general  EBL:  Total I/O In: 2000 [I.V.:2000] Out: 125 [Urine:75; Blood:50]  BLOOD ADMINISTERED:none  DRAINS: Urinary Catheter (Foley)   LOCAL MEDICATIONS USED:  LIDOCAINE   SPECIMEN:  Source of Specimen:   Uterus and cervix  DISPOSITION OF SPECIMEN:  PATHOLOGY  COUNTS:  YES  TOURNIQUET:  * No tourniquets in log *  DICTATION: .Other Dictation: Dictation Number    PLAN OF CARE: Admit for overnight observation  PATIENT DISPOSITION:  PACU - hemodynamically stable.   Delay start of Pharmacological VTE agent (>24hrs) due to surgical blood loss or risk of bleeding: not applicable

## 2014-07-01 NOTE — Progress Notes (Signed)
Update to History and Physical  No marked change in status.  Patient examined.   OK to proceed. Declines removal of tubes and ovaries.

## 2014-07-01 NOTE — Anesthesia Preprocedure Evaluation (Addendum)
Anesthesia Evaluation  Patient identified by MRN, date of birth, ID band Patient awake    Reviewed: Allergy & Precautions, H&P , Patient's Chart, lab work & pertinent test results, reviewed documented beta blocker date and time   Airway Mallampati: II TM Distance: >3 FB Neck ROM: full    Dental no notable dental hx.    Pulmonary  breath sounds clear to auscultation  Pulmonary exam normal       Cardiovascular Rhythm:regular Rate:Normal     Neuro/Psych    GI/Hepatic   Endo/Other    Renal/GU      Musculoskeletal   Abdominal   Peds  Hematology   Anesthesia Other Findings Exercises daily vigorously  Reproductive/Obstetrics                          Anesthesia Physical Anesthesia Plan  ASA: II  Anesthesia Plan: General   Post-op Pain Management:    Induction: Intravenous  Airway Management Planned: Oral ETT  Additional Equipment:   Intra-op Plan:   Post-operative Plan: Extubation in OR  Informed Consent: I have reviewed the patients History and Physical, chart, labs and discussed the procedure including the risks, benefits and alternatives for the proposed anesthesia with the patient or authorized representative who has indicated his/her understanding and acceptance.   Dental Advisory Given and Dental advisory given  Plan Discussed with: CRNA and Surgeon  Anesthesia Plan Comments: (  Discussed general anesthesia, including possible nausea, instrumentation of airway, sore throat,pulmonary aspiration, etc. I asked if the were any outstanding questions, or  concerns before we proceeded. )        Anesthesia Quick Evaluation

## 2014-07-01 NOTE — Transfer of Care (Signed)
Immediate Anesthesia Transfer of Care Note  Patient: Crystal Carr  Procedure(s) Performed: Procedure(s): HYSTERECTOMY VAGINAL (N/A) ANTERIOR (CYSTOCELE) AND POSTERIOR REPAIR (RECTOCELE)  (N/A)  TRANSVAGINAL TAPE (TVT) PROCEDURE, (N/A) CYSTOSCOPY (N/A)  Patient Location: PACU  Anesthesia Type:General  Level of Consciousness: sedated  Airway & Oxygen Therapy: Patient Spontanous Breathing and Patient connected to nasal cannula oxygen  Post-op Assessment: Report given to PACU RN and Post -op Vital signs reviewed and stable  Post vital signs: stable  Complications: No apparent anesthesia complications

## 2014-07-01 NOTE — Op Note (Signed)
NAME:  Crystal Carr, [name]:  [id number]  MEDICAL RECORD NO.:  [id number]  LOCATION:  [facility]                          FACILITY:  [facility]  PHYSICIAN:  Crystal Carr, M.D.   DATE OF BIRTH:  03-04-1951  DATE OF PROCEDURE:  07/01/2014 DATE OF DISCHARGE:                              OPERATIVE REPORT   PREOPERATIVE DIAGNOSIS:  Genuine stress incontinence, incomplete uterovaginal prolapse.  POSTOPERATIVE DIAGNOSIS:  Genuine stress incontinence, incomplete uterovaginal prolapse.  PROCEDURE:  Total vaginal hysterectomy with anterior and posterior colporrhaphy, TVT exact mid urethral sling, cystoscopy.  SURGEON:  Crystal Carr, M.D.  ASSISTANT:  Gus Height, M.D.  ANESTHESIA:  General endotracheal, local with 1% lidocaine with epinephrine 1:100,000.  IV FLUIDS:  2000 mL Ringer's lactate.  EBL:  50 mL.  URINE OUTPUT:  75 mL.  COMPLICATIONS:  None.  INDICATIONS FOR THE PROCEDURE:  The patient is a 63 year old, gravida 2, para 2 female who is referred by her primary gynecologist, Dr. Gus Height for uterovaginal prolapse.  The patient tried a pessary which was not successful.  The patient has been experiencing urinary incontinence with sneezing and coughing.  On pelvic examination, the patient is noted to have second-degree cystocele, first-degree uterine prolapse, and a second-degree rectocele.  Multichannel urodynamic testing in the office has confirmed the presence of genuine stress incontinence.  The patient desires surgical treatment of her uterovaginal prolapse and genuine stress incontinence and a plan is therefore made to proceed now with a total vaginal hysterectomy with anterior and posterior colporrhaphy, a TVT exact mid urethral sling, and cystoscopy after risks, benefits, and alternatives were reviewed.  FINDINGS:  Examination under anesthesia revealed a second-degree cystocele, 1st to second-degree uterine prolapse, and a 1st to 2nd degree rectocele.   The uterus was noted to be normal.  The bilateral tubes and ovaries were unremarkable.  The ovaries were atrophic.  Cystoscopy with placement of the mid urethral sling demonstrated the bladder to be normal throughout 360 degrees including the bladder dome and trigone.  There was no evidence of a foreign body in the bladder, the urethra.  The ureters were noted to be patent bilaterally.  SPECIMENS:  The uterine and cervix were sent to pathology.  PROCEDURE:  The patient was reidentified in the preoperative hold area. She did receive clindamycin and gentamicin IV for antibiotic prophylaxis.  She received TED hose and PAS stockings for DVT prophylaxis.  In the operating room, the patient was placed in the supine position on the operating room table.  General endotracheal anesthesia was induced and the patient was then placed in the dorsal lithotomy position in the Ollie stirrups.  The abdomen, vagina, and perineum were then sterilely prepped and draped and a Foley catheter was placed inside the bladder and left to gravity drainage throughout the procedure.  A single-tooth tenaculum was placed on the anterior cervical lip.  The cervix was noted to be very small.  The cervix was circumferentially injected with 1% lidocaine with epinephrine 1:100,000.  An incision was then created around the cervix circumferentially using a scalpel.  The bladder was dissected anteriorly off of the cervix with the Metzenbaum scissors.  A similar dissection was performed posteriorly.  Initially  the Posterior cul de sac could not be entered, and the very distal uterosacral Ligaments were clamped, divided and suture ligated with 0 vicryl suture. The posterior cul-de-sac was then entered sharply with the Mayo scissors.  The uterosacral ligaments were clamped bilaterally.  The pedicles were cut using the Mayo scissors and then the uterosacral ligaments were sutured using transfixing sutures of 0 Vicryl.  A long  weight speculum was placed In the posterior cul de sac. The bladder pillars were clamped, divided and Suture ligated with 0 vicryl bilaterally. With the bladder dissected away, Heaney clamps were used to continue to clamp the inferior aspects of the cardinal ligaments bilaterally.  The pedicles were divided and suture ligated with 0 Vicryl.  Eventually, the anterior cul-de-sac was entered sharply and a Deaver retractor was placed in the anterior cul-de-sac. The remaining cardinal ligament pedicles were clamped, sharply divided, and suture ligated with 0 Vicryl.  The adnexal pedicles were clamped, sharply divided, and then were free tied with 0 Vicryl followed by a suture ligature of the same.  All of the pedicles were examined and found  to be hemostatic.  The uterine specimen was sent to pathology.  A moistened sponge was placed in the cul-de-sac.  The McCall culdoplasty suture was placed at this time using 0 Vicryl.  The suture was brought through the vagina into the posterior cul-de-sac at the 6 o'clock position.  It was brought out through the distal left uterosacral ligament, across the posterior cul-de-sac in a pursestring fashion, down through the distal right uterosacral ligament and then into the cul-de- sac and out the vagina at the 6 o'clock position.  The posterior vaginal cuff was sutured at this time with a running lock suture of 0 Vicryl.  Hemostasis was good.  The moistened sponge was removed from the cul-de-sac at this time and the anterior cul-de-sac was performed.  Allis clamps were used to mark the anterior vaginal mucosa in the midline.  The mucosa was injected locally with 1% lidocaine with epinephrine 1:100,000.  The vaginal mucosa was incised vertically with a Metzenbaum scissors.  A combination of sharp and blunt dissection were used to dissect the vaginal tissue and bladder off the overlying vaginal mucosa bilaterally.  The dissection was carried to the level  of the pubic rami and down to the level of the uterosacral ligaments.  Hemostasis was good.  The sling was placed at this time.  An 1 cm suprapubic incisions were then created 2 cm to the right and left to the midline.  The Foley catheter was removed at this time and the obturator with a Foley tip was then placed inside the urethra.  The TVT exact was performed in a standard bottom up fashion.  The urethra was deflected properly and then the right retropubic space was entered with a needle guide and brought out through the ipsilateral right suprapubic incision.  The urethra was then deflected in the other direction and the same was performed on the left-hand side.  Cystoscopy was performed at this time and the findings are as noted above.  The cystoscopic fluid was drained and the Foley catheter was replaced.  The sling was brought up through the suprapubic incisions bilaterally.  A Kelly clamp was placed between the urethra and the sling as the plastic sheaths were removed bilaterally.  The sling was noted to be in good position.  An excess sling was trimmed.  The anterior colporrhaphy was performed at this time, beginning with vertical mattress  sutures of 2-0 Vicryl and then finishing with vertical mattress sutures of 0 Vicryl.  Excess vaginal mucosa was trimmed and the anterior vaginal wall was closed with a running locked suture of 2-0 Vicryl.  The vaginal cuff was closed with a running locked suture of 0 Vicryl.  The posterior colporrhaphy was performed at this time.  Allis clamps were used to mark along the perineal body and the posterior vaginal mucosa up to the level of a McCall culdoplasty suture.  The perineal body and the posterior vaginal mucosa were then injected with 1% lidocaine with epinephrine 1:100,000.  A triangular wedge of epithelium was excised from the perineal body and the posterior vaginal mucosa was opened sharply with a Metzenbaum scissors.  Sharp and blunt  dissection were used to dissect the rectovaginal fascia off the vaginal mucosa bilaterally.  Hemostasis was good during the dissection.  The top suture for the colporrhaphy was a pursestring suture of 2-0 Vicryl.  Then, 0 Vicryl vertical mattress sutures were used to reduce the rectocele. Excess vaginal mucosa was trimmed and the posterior vaginal mucosa was closed with a running locked suture of 2-0 Vicryl.  A crown stitch of 0 Vicryl was placed in the perineal body.  A 2-0 Vicryl suture was brought behind the hymenal ring, and then across the superficial perineal muscles in a running fashion.  The suture was brought up the perineal body in a subcuticular fashion as for an episiotomy repair.  The knot was tied inside the hymen.  The culdoplasty suture was tied at this time and there was excellent elevation and support of the vaginal cuff.  Rectal exam confirmed the absence of sutures in the rectum and the rectum was intact.  The suprapubic incisions at this time were closed with subcuticular sutures of 3-0 Vicryl.  Dermabond was placed over the incisions.  The patient was cleansed of any Betadine.  She was awakened and extubated.  She was escorted to the recovery room in stable and awake condition.  There were no complications to the procedure.  All needle, instrument, and sponge counts were correct.     Crystal Carr, M.D.     BES/MEDQ  D:  07/01/2014  T:  07/01/2014  Job:  [id number]  cc:   Gus Height, M.D.

## 2014-07-02 ENCOUNTER — Encounter (HOSPITAL_COMMUNITY): Payer: Self-pay | Admitting: Obstetrics and Gynecology

## 2014-07-02 LAB — BASIC METABOLIC PANEL
Anion gap: 7 (ref 5–15)
BUN: 9 mg/dL (ref 6–23)
CHLORIDE: 104 meq/L (ref 96–112)
CO2: 28 meq/L (ref 19–32)
CREATININE: 0.69 mg/dL (ref 0.50–1.10)
Calcium: 8.8 mg/dL (ref 8.4–10.5)
GFR calc Af Amer: >90 mL/min (ref >90)
GFR calc non Af Amer: >90 mL/min (ref >90)
Glucose, Bld: 115 mg/dL — ABNORMAL HIGH (ref 70–99)
POTASSIUM: 4.7 meq/L (ref 3.7–5.3)
Sodium: 139 mEq/L (ref 137–147)

## 2014-07-02 LAB — CBC
HCT: 30.7 % — ABNORMAL LOW (ref 36.0–46.0)
Hemoglobin: 10.5 g/dL — ABNORMAL LOW (ref 12.0–15.0)
MCH: 30.1 pg (ref 26.0–34.0)
MCHC: 34.2 g/dL (ref 30.0–36.0)
MCV: 88 fL (ref 78.0–100.0)
Platelets: 176 K/uL (ref 150–400)
RBC: 3.49 MIL/uL — ABNORMAL LOW (ref 3.87–5.11)
RDW: 12.9 % (ref 11.5–15.5)
WBC: 8.5 K/uL (ref 4.0–10.5)

## 2014-07-02 MED ORDER — DSS 100 MG PO CAPS: 100.0000 mg | ORAL_CAPSULE | Freq: Every day | ORAL | Status: DC

## 2014-07-02 MED ORDER — IBUPROFEN 600 MG PO TABS: 600.0000 mg | ORAL_TABLET | Freq: Four times a day (QID) | ORAL | Status: DC | PRN

## 2014-07-02 MED ORDER — OXYCODONE-ACETAMINOPHEN 5-325 MG PO TABS: 1.0000 | ORAL_TABLET | ORAL | Status: DC | PRN

## 2014-07-02 NOTE — Anesthesia Postprocedure Evaluation (Signed)
  Anesthesia Post-op Note  Patient: Crystal Carr  Procedure(s) Performed: Procedure(s): HYSTERECTOMY VAGINAL (N/A) ANTERIOR (CYSTOCELE) AND POSTERIOR REPAIR (RECTOCELE)  (N/A)  TRANSVAGINAL TAPE (TVT) PROCEDURE, (N/A) CYSTOSCOPY (N/A)  Patient Location: [facility]  Anesthesia Type:General  Level of Consciousness: awake, alert , oriented and patient cooperative  Airway and Oxygen Therapy: Patient Spontanous Breathing  Post-op Pain: mild  Post-op Assessment: Patient's Cardiovascular Status Stable, Respiratory Function Stable, No signs of Nausea or vomiting and Pain level controlled  Post-op Vital Signs: stable  Last Vitals:  Filed Vitals:   07/02/14 0743  BP: 86/55  Pulse: 65  Temp:   Resp:     Complications: No apparent anesthesia complications

## 2014-07-02 NOTE — Discharge Instructions (Signed)
Vaginal Hysterectomy  A  vaginal hysterectomy is a surgical procedure to remove the uterus and cervix, and sometimes the ovaries and fallopian tubes. During a vaginal hysterectomy,  the surgical removal is done through the vagina. Generally, recovery time is faster and there are fewer complications after vaginal procedures than after open incisional procedures. LET [facility] CARE PROVIDER KNOW ABOUT:   Any allergies you have.  All medicines you are taking, including vitamins, herbs, eye drops, creams, and over-the-counter medicines.  Previous problems you or members of your family have had with the use of anesthetics.  Any blood disorders you have.  Previous surgeries you have had.  Medical conditions you have. RISKS AND COMPLICATIONS Generally, this is a safe procedure. However, as with any procedure, complications can occur. Possible complications include:  Allergies to medicines.  Difficulty breathing.  Bleeding.  Infection.  Damage to other structures near your uterus and cervix. BEFORE THE PROCEDURE  Ask your health care provider about changing or stopping your regular medicines.  Take certain medicines, such as a colon-emptying preparation, as directed.  Do not eat or drink anything for at least 8 hours before your surgery.  Stop smoking if you smoke. Stopping will improve your health after surgery.  Arrange for a ride home after surgery and for help at home during recovery. PROCEDURE   An IV tube will be put into one of your veins in order to give you fluids and medicines.  You will receive medicines to relax you and medicines that make you sleep (general anesthetic).  You may have a flexible tube (catheter) put into your bladder to drain urine.  You may have a tube put through your nose or mouth that goes into your stomach (nasogastric tube). The nasogastric tube removes digestive fluids and prevents you from feeling nauseated and from  vomiting.  Tight-fitting (compression) stockings will be placed on your legs to promote circulation.   An incision will be made in your vagina. Probes and tools will be inserted into the small incisions. The uterus and cervix are removed (and possibly your ovaries and fallopian tubes) through your vagina.  Your vagina is then sewn back to normal. AFTER THE PROCEDURE  You may have a liquid diet temporarily. You will most likely return to, and tolerate, your usual diet the day after surgery.  You will be passing urine through a catheter. It will be removed the day after surgery.  Your temperature, breathing rate, heart rate, blood pressure, and oxygen level will be monitored regularly.  You will still wear compression stockings on your legs until you are able to move around.  You will use a special device or do breathing exercises to keep your lungs clear.  You will be encouraged to walk as soon as possible.  Please call if you have fever, heavy vaginal bleeding, nausea and vomiting, increasing pain, or any other concern.  Place nothing in the vagina for 12 weeks.   Do not drive for 2 weeks.  Do not push, pull, carry, or haul anything over 10 pounds for 12 weeks. Document Released: 11/17/2011 Document Revised: 07/31/2013 Document Reviewed: 06/13/2013 [facility] Patient Information 2015 [place], [place]. This information is not intended to replace advice given to you by your health care provider. Make sure you discuss any questions you have with your health care provider.  Tension Free Vaginal Tape System, Care After Please read the instructions below. Refer to these instructions for the next few weeks. These instructions provide you with general information  on caring for yourself after your operation. Your caregiver may also give you specific instructions. While your treatment has been planned according to the most current medical practices available, unavoidable problems sometimes occur.  Discomfort from the operation area is normal for a couple of weeks. If you have any questions or problems after discharge, please call your caregiver. HOME CARE INSTRUCTIONS   Take your prescribed medications as directed by your caregiver.  You may take over-the-counter medication for minor pain with your caregiver's recommendation.  You may resume your usual diet.  Do not take aspirin. It can cause bleeding.  It is helpful to have a responsible person with you for a few days or a week after the surgery.  Shower or bath as directed.  Do not lift anything over 5 pounds.  Change your dressing as directed.  Do not drive until your caregiver says it is OK.  Do not have sexual intercourse until your caregiver says it is OK.  Do not use tampons.  You may take a laxative with your caregiver's recommendation.  You may take sitz baths 2 to 3 times a day with your caregiver's advice.  Make and keep your postoperative appointments. SEEK MEDICAL CARE IF:   There is increasing pain in the wound area.  There is swelling or redness in the wound area.  You develop abnormal vaginal discharge.  You develop a rash.  You develop nausea, vomiting, constipation or diarrhea.  You think the stitches in the wound are breaking.  You lose urine when you cough.  You have problems with your medications. SEEK IMMEDIATE MEDICAL CARE IF:   You develop a temperature of 102 F (38.9 C) or higher.  You have bleeding from the wound area above the pubic bone or from the vagina.  You see pus coming from the incisions.  You cannot urinate.  You develop bloody or painful urination.  You pass out.  You develop leg or chest pain.  You develop abdominal pain.  You develop shortness of breath. Document Released: 02/24/2009 Document Revised: 09/18/2013 Document Reviewed: 02/24/2009 [facility] Patient Information 2015 [place], [place]. This information is not intended to replace advice given to  you by your health care provider. Make sure you discuss any questions you have with your health care provider.

## 2014-07-02 NOTE — Progress Notes (Signed)
Ambulated out teaching complete  Pt voide 800cc and scan 68

## 2014-07-02 NOTE — Progress Notes (Signed)
1 Day Post-Op Procedure(s) (LRB): HYSTERECTOMY VAGINAL (N/A) ANTERIOR (CYSTOCELE) AND POSTERIOR REPAIR (RECTOCELE)  (N/A)  TRANSVAGINAL TAPE (TVT) PROCEDURE, (N/A) CYSTOSCOPY (N/A)  Subjective: Patient reports vomiting.   Had vomiting last hs.  Will try to eat breakfast now.  Vaginal packing and foley out.  Voided but had a residual and was catheterized once.  Objective: I have reviewed patient's vital signs and intake and output. UO 2500 cc.  Void 225 and then 125 cc. PVR 250 cc.  WBC 8.5, Hgb 10.5, glucose 115.  General: alert Resp: clear to auscultation bilaterally Cardio: regular rate and rhythm, S1, S2 normal, no murmur, click, rub or gallop GI: soft, non-tender; bowel sounds normal; no masses,  no organomegaly and incision:  suprapubic dressings clean, dry, intact.  Vaginal Bleeding: minimal  Assessment: s/p Procedure(s): HYSTERECTOMY VAGINAL (N/A) ANTERIOR (CYSTOCELE) AND POSTERIOR REPAIR (RECTOCELE)  (N/A)  TRANSVAGINAL TAPE (TVT) PROCEDURE, (N/A) CYSTOSCOPY (N/A): progressing well and urinary retention Post op anemia.  Blood pressures low post op, but no tachycardia and clinical picture reassuring.   Plan: Advance diet Encourage ambulation Advance to PO medication Discontinue IV fluids Discharge home  Will continue bladder trials and discharge to home without cath if PVRs under 100 cc.  Instructions and precautions reviewed.  Rx for Percocet and Motrin.  Recommend  Miralax after arrives home and then daily Colace 100 mg. Has follow up for next week in office.  Again reviewed surgical findings and procedure.  Questions answered.   LOS: 1 day    Crystal Carr 07/02/2014, 8:19 AM

## 2014-07-04 ENCOUNTER — Telehealth: Payer: Self-pay | Admitting: Obstetrics and Gynecology

## 2014-07-04 NOTE — Telephone Encounter (Signed)
Patient has some questions about her medication she is supposed to take since her procedure Tuesday.

## 2014-07-04 NOTE — Telephone Encounter (Signed)
Spoke with patient. Patient states that she had surgery on Tuesday and was given stool softener and laxatives. Patient has been taking the stool softener since Tuesday and began taking the laxative yesterday. Patient had three "loose" bowel movements yesterday and one this morning. Patient calling to find out how long she should be taking these after surgery. Advised would send a message over to Dr.Silva and covering provider and give patient a call back with further recommendations. Patient agreeable.  Routing to Dr.Lathrop as covering Cc: Dr.Silva

## 2014-07-04 NOTE — Telephone Encounter (Signed)
I would have her take a stool softener daily just as needed at this point.  It sounds like her bowel function has returned.  Has her keep in mind that narcotics can constipate.

## 2014-07-04 NOTE — Telephone Encounter (Signed)
Spoke with patient. Advised of message as seen below from [place]. Patient states that she has only been taking one percocet per day and would like to know if this can cause slight constipation. Advised that with taking narcotics constipation can be a side effect. Advised if she is having difficulty having a bowel movement or becomes less regular can take stool softener daily. Advised this will not be harmful. Advised can continue to take stool softener daily if she would like but does not have to take daily. Patient is agreeable and verbalizes understanding.  Routing to provider for final review. Patient agreeable to disposition. Will close encounter

## 2014-07-06 NOTE — Discharge Summary (Signed)
Physician Discharge Summary  Patient ID: Crystal Carr MRN: [id number] DOB/AGE: May 13, 1951 63 y.o.  Admit date: 07/01/2014 Discharge date: 07/02/14  Admission Diagnoses: 1.  Incomplete uterovaginal prolapse 2.  Genuine stress incontinence  Discharge Diagnoses:  1.  Incomplete uterovaginal prolapse 2.   Genuine stress incontinence 3.  Status post total vaginal hysterectomy with anterior and posterior colporrhaphy, TVT Exact Midurethral Sling, Cystoscopy Active Problems:   S/P vaginal hysterectomy   Discharged Condition: good  Hospital Course: The patient was admitted on 07/01/14 for a total vaginal hysterectomy with anterior and posterior colporrhapy, TVT Exact midurethral sling and cystoscopy which were performed without complication while under general anesthesia.  The patient's post op course was uneventful.  She had a morphine PCA and Toradol for pain control initially, and this was converted over to Percocet and Motrin on post op day one when the patient began taking po well.  She ambulated independently and wore PAS and Ted hose for DVT prophylaxis while in bed.  Her vaginal packing and foley catheter were removed on post op day one, and she voided good volumes and had residual volumes under 100 cc.  The patient's vital signs remained stable and she demonstrated no signs of infection during her hospitalization.  The patient's post op day one Hgb was 10.5, her WBC was 8.5, and her creatinine was 0.69.  She was found to be in good condition and ready for discharge on post op day one.  She was discharged home without a catheter.  Consults: None  Significant Diagnostic Studies: labs:  See Hospital course above.  Treatments: surgery: total vaginal hysterectomy with anterior and posterior colporrhaphy, TVT Exact Midurethral Sling, Cystoscopy on 07/01/14.  Discharge Exam: Blood pressure 97/49, pulse 66, temperature 97.7 F (36.5 C), temperature source Oral, resp. rate 16, height 5\' 3"  (1.6  m), weight 125 lb (56.7 kg), last menstrual period 02/09/2002, SpO2 97.00%. General: alert  Resp: clear to auscultation bilaterally  Cardio: regular rate and rhythm, S1, S2 normal, no murmur, click, rub or gallop  GI: soft, non-tender; bowel sounds normal; no masses, no organomegaly and incision: suprapubic dressings clean, dry, intact.  Vaginal Bleeding: minimal  Disposition: 01-Home or Self Care  Instructions and precautions reviewed. Patient will call for fever, nausea and vomiting, heavy vaginal bleeding, increasing pain, difficulty voiding, pain and swelling in her leg(s), or any other concern.  Decreased activity and pelvic rest for 12 weeks.  Patient instructed in the need for ambulation several times a day for DVT prophylaxis. Follow up in one week, sooner as needed.    Medication List    STOP taking these medications       ESTRACE VAGINAL 0.1 MG/GM vaginal cream  Generic drug:  estradiol      TAKE these medications       calcium carbonate 1250 MG capsule  Take 600 mg by mouth daily.     desonide 0.05 % ointment  Commonly known as:  DESOWEN  Apply 1 application topically as needed.     DSS 100 MG Caps  Take 100 mg by mouth daily.     ELIDEL 1 % cream  Generic drug:  pimecrolimus  Apply 1 application topically as needed.     FISH OIL + D3 1000-1000 MG-UNIT Caps  Take 1,000 mg by mouth daily.     ibuprofen 600 MG tablet  Commonly known as:  ADVIL,MOTRIN  Take 1 tablet (600 mg total) by mouth every 6 (six) hours as needed (mild pain).  levothyroxine 25 MCG tablet  Commonly known as:  SYNTHROID, LEVOTHROID  Take 25 mcg by mouth daily.     multivitamin with minerals Tabs tablet  Take 1 tablet by mouth daily.     oxyCODONE-acetaminophen 5-325 MG per tablet  Commonly known as:  PERCOCET/ROXICET  Take 1-2 tablets by mouth every 4 (four) hours as needed for severe pain (moderate to severe pain (when tolerating fluids)).     VANOS 0.1 % Crea  Generic drug:   Fluocinonide  Apply 1 application topically as needed.     Vitamin D-3 1000 UNITS Caps  Take 1,000 Units by mouth daily.     ZYRTEC ALLERGY PO  Take by mouth as needed.         Signed: Tacy Learn 07/06/2014, 8:12 AM

## 2014-07-09 ENCOUNTER — Ambulatory Visit (INDEPENDENT_AMBULATORY_CARE_PROVIDER_SITE_OTHER): Payer: BC Managed Care – PPO | Admitting: Obstetrics and Gynecology

## 2014-07-09 ENCOUNTER — Encounter: Payer: Self-pay | Admitting: Obstetrics and Gynecology

## 2014-07-09 VITALS — BP 102/58 | HR 60 | Temp 98.9°F | Ht 63.0 in | Wt 124.0 lb

## 2014-07-09 DIAGNOSIS — Z9889 Other specified postprocedural states: Secondary | ICD-10-CM

## 2014-07-09 NOTE — Progress Notes (Signed)
GYNECOLOGY  VISIT   HPI: 63 y.o.   Divorced  Hispanic  female   [phone] with Patient's last menstrual period was 02/09/2002.   here for  Post op from vaginal hysterectomy Taking a Motrin in the evening.  Used Percocet for the first two nights only.  Bowel movements once a day.  Taking Colace still. Voiding well.  Had some bleeding 6 days ago.  Pads not soaked.  Changed pad four times that day.  Bleeding now just spotting.   Had a temp to 99.2 and 99.4 yesterday and the day before.  Not today.  Nothing was bothering the patient other than fatigue.  Felt achy.  No real pain with urination. No cough or congestion. No pain or swelling in legs.  GYNECOLOGIC HISTORY: Patient's last menstrual period was 02/09/2002. Contraception:   none Menopausal hormone therapy: none        OB History   Grav Para Term Preterm Abortions TAB SAB Ect Mult Living   2 2 2       2          Patient Active Problem List   Diagnosis Date Noted  . S/P vaginal hysterectomy 07/01/2014  . Genuine stress incontinence, female 06/01/2014  . Uterovaginal prolapse, incomplete 06/01/2014    Past Medical History  Diagnosis Date  . Osteopenia   . Thyroid disease     Hyperthyroid--had iodine therapy  . Urinary incontinence     with sneezing, coughing  . Hypothyroidism     Past Surgical History  Procedure Laterality Date  . Cataract extraction Bilateral   . Thoracic outlet surgery    . Vaginal hysterectomy N/A 07/01/2014    Procedure: HYSTERECTOMY VAGINAL;  Surgeon: Jamey Reas de Berton Lan, MD;  Location: [facility];  Service: Gynecology;  Laterality: N/A;  . Bladder suspension N/A 07/01/2014    Procedure:  TRANSVAGINAL TAPE (TVT) PROCEDURE,;  Surgeon: Jamey Reas de Berton Lan, MD;  Location: [facility];  Service: Gynecology;  Laterality: N/A;  . Cystoscopy N/A 07/01/2014    Procedure: CYSTOSCOPY;  Surgeon: Jamey Reas de Berton Lan, MD;  Location: [facility];  Service: Gynecology;   Laterality: N/A;    Current Outpatient Prescriptions  Medication Sig Dispense Refill  . docusate sodium 100 MG CAPS Take 100 mg by mouth daily.  30 capsule  0  . ibuprofen (ADVIL,MOTRIN) 600 MG tablet Take 1 tablet (600 mg total) by mouth every 6 (six) hours as needed (mild pain).  30 tablet  0  . levothyroxine (SYNTHROID, LEVOTHROID) 25 MCG tablet Take 25 mcg by mouth daily.      . calcium carbonate 1250 MG capsule Take 600 mg by mouth daily.       . Cetirizine HCl (ZYRTEC ALLERGY PO) Take by mouth as needed.      . Cholecalciferol (VITAMIN D-3) 1000 UNITS CAPS Take 1,000 Units by mouth daily.       Marland Kitchen desonide (DESOWEN) 0.05 % ointment Apply 1 application topically as needed.      Marland Kitchen ELIDEL 1 % cream Apply 1 application topically as needed.      . Fish Oil-Cholecalciferol (FISH OIL + D3) 1000-1000 MG-UNIT CAPS Take 1,000 mg by mouth daily.      . Multiple Vitamin (MULTIVITAMIN WITH MINERALS) TABS tablet Take 1 tablet by mouth daily.      Marland Kitchen oxyCODONE-acetaminophen (PERCOCET/ROXICET) 5-325 MG per tablet Take 1-2 tablets by mouth every 4 (four) hours as needed for severe pain (moderate  to severe pain (when tolerating fluids)).  30 tablet  0  . VANOS 0.1 % CREA Apply 1 application topically as needed.       No current facility-administered medications for this visit.     ALLERGIES: Almond oil; Cefzil; Fig extract; and Peanuts  Family History  Problem Relation Age of Onset  . Pancreatic cancer Mother     History   Social History  . Marital Status: Divorced    Spouse Name: N/A    Number of Children: N/A  . Years of Education: N/A   Occupational History  . Not on file.   Social History Main Topics  . Smoking status: Never Smoker   . Smokeless tobacco: Not on file  . Alcohol Use: 0.5 oz/week    1 drink(s) per week  . Drug Use: No  . Sexual Activity: Not Currently    Partners: Male    Birth Control/ Protection: Post-menopausal   Other Topics Concern  . Not on file   Social  History Narrative  . No narrative on file    ROS:  Pertinent items are noted in HPI.  PHYSICAL EXAMINATION:    BP 102/58  Pulse 60  Ht 5\' 3"  (1.6 m)  Wt 124 lb (56.246 kg)  BMI 21.97 kg/m2  LMP 02/09/2002    T 98.9  General appearance: alert, cooperative and appears stated age Lungs: clear to auscultation bilaterally Heart: regular rate and rhythm Abdomen: soft, non-tender; no masses,  no organomegaly No abnormal inguinal nodes palpated  Pelvic: External genitalia:  suprapubic incisions well healed.  Some vulvar ecchymoses without induration.              Urethra:  normal appearing urethra with no masses, tenderness or lesions                     Bimanual Exam:   Uterus and cervix absent.   Anterior and posterior suture lines intact.  Sling well protected.                      Pathology report - benign cervix, uterus with small fibroids and benign endometrium.            ASSESSMENT  Status post TVH, anterior and posterior colporrhaphy, TVT Exact midurethral sling, cystoscopy.  Doing well post op.  Low grade fever yesterday.  Exam nonfocal.   PLAN  Continue decreased activity.  Follow up in 5 weeks and prn.  Call for recurrent fever.   An After Visit Summary was printed and given to the patient.  _20_____ minutes face to face time of which over 50% was spent in counseling.

## 2014-08-13 ENCOUNTER — Encounter: Payer: Self-pay | Admitting: Obstetrics and Gynecology

## 2014-08-13 ENCOUNTER — Ambulatory Visit (INDEPENDENT_AMBULATORY_CARE_PROVIDER_SITE_OTHER): Payer: BC Managed Care – PPO | Admitting: Obstetrics and Gynecology

## 2014-08-13 VITALS — BP 100/62 | HR 62 | Resp 14 | Ht 63.0 in | Wt 126.2 lb

## 2014-08-13 DIAGNOSIS — Z9889 Other specified postprocedural states: Secondary | ICD-10-CM

## 2014-08-13 NOTE — Patient Instructions (Signed)
You may return to work tomorrow with no lifting over 10 pounds until three months from the date of the surgery.

## 2014-08-13 NOTE — Progress Notes (Signed)
Patient ID: Crystal Carr, female   DOB: [birth year], 63 y.o.   MRN: [id number] GYNECOLOGY  VISIT   HPI: 63 y.o.   Divorced  Caucasian  female   G2P2002 with Patient's last menstrual period was 02/09/2002.   here for  6 weeks post op TVH/A & P colporrhaphy, exact midurethral sling/cystoscopy 07-01-14.  Ovaries remain.   Patient asking, "Can you tell me what you did again for the surgery and how you did everything?  I was so stressed out about it."  Having more energy.  No leakage of urine.  Normal bowel function. Some vaginal drainage.  More hot flashes.  Feels more aware of her ovaries.   Needs note for return to work.   GYNECOLOGIC HISTORY: Patient's last menstrual period was 02/09/2002. Contraception:  Postmenopausal/TVH  Menopausal hormone therapy: none        OB History   Grav Para Term Preterm Abortions TAB SAB Ect Mult Living   2 2 2       2          Patient Active Problem List   Diagnosis Date Noted  . S/P vaginal hysterectomy 07/01/2014  . Genuine stress incontinence, female 06/01/2014  . Uterovaginal prolapse, incomplete 06/01/2014    Past Medical History  Diagnosis Date  . Osteopenia   . Thyroid disease     Hyperthyroid--had iodine therapy  . Urinary incontinence     with sneezing, coughing  . Hypothyroidism     Past Surgical History  Procedure Laterality Date  . Cataract extraction Bilateral   . Thoracic outlet surgery    . Vaginal hysterectomy N/A 07/01/2014    Procedure: HYSTERECTOMY VAGINAL;  Surgeon: Jamey Reas de Berton Lan, MD;  Location: [facility];  Service: Gynecology;  Laterality: N/A;  . Bladder suspension N/A 07/01/2014    Procedure:  TRANSVAGINAL TAPE (TVT) PROCEDURE,;  Surgeon: Jamey Reas de Berton Lan, MD;  Location: [facility];  Service: Gynecology;  Laterality: N/A;  . Cystoscopy N/A 07/01/2014    Procedure: CYSTOSCOPY;  Surgeon: Jamey Reas de Berton Lan, MD;  Location: [facility];  Service: Gynecology;   Laterality: N/A;    Current Outpatient Prescriptions  Medication Sig Dispense Refill  . Calcium Citrate-Vitamin D ([employer] CALCIUM CITRATE + D PO) Take 600 mg by mouth 2 (two) times daily.      . Cetirizine HCl (ZYRTEC ALLERGY PO) Take by mouth as needed.      . Cholecalciferol (VITAMIN D-3) 1000 UNITS CAPS Take 1,000 Units by mouth daily.       Marland Kitchen desonide (DESOWEN) 0.05 % ointment Apply 1 application topically as needed.      Marland Kitchen ELIDEL 1 % cream Apply 1 application topically as needed.      . Fish Oil-Cholecalciferol (FISH OIL + D3) 1000-1000 MG-UNIT CAPS Take 1,000 mg by mouth daily.      Marland Kitchen levothyroxine (SYNTHROID, LEVOTHROID) 25 MCG tablet Take 25 mcg by mouth daily.      . Multiple Vitamin (MULTIVITAMIN WITH MINERALS) TABS tablet Take 1 tablet by mouth daily.       No current facility-administered medications for this visit.     ALLERGIES: Almond oil; Cefzil; Fig extract; and Peanuts  Family History  Problem Relation Age of Onset  . Pancreatic cancer Mother     History   Social History  . Marital Status: Divorced    Spouse Name: N/A    Number of Children: N/A  . Years of Education: N/A  Occupational History  . Not on file.   Social History Main Topics  . Smoking status: Never Smoker   . Smokeless tobacco: Not on file  . Alcohol Use: 0.5 oz/week    1 drink(s) per week  . Drug Use: No  . Sexual Activity: Not Currently    Partners: Male    Birth Control/ Protection: Post-menopausal, Surgical     Comment: TVH   Other Topics Concern  . Not on file   Social History Narrative  . No narrative on file    ROS:  Pertinent items are noted in HPI.  PHYSICAL EXAMINATION:    BP 100/62  Pulse 62  Resp 14  Ht 5\' 3"  (1.6 m)  Wt 126 lb 3.2 oz (57.244 kg)  BMI 22.36 kg/m2  LMP 02/09/2002     General appearance: alert, cooperative and appears stated age  Pelvic: External genitalia:  no lesions              Urethra:  normal appearing urethra with no masses, tenderness  or lesions, sling protected with no exposure or erosion.               Bartholins and Skenes: normal                 Vagina: cuff intact and anterior and posterior sutures lines intact and still healing.               Cervix:  absent                   Bimanual Exam:  Uterus:  absent                                      Adnexa: no masses.                                                                          ASSESSMENT  Doing well post op.    PLAN  Reviewed surgical procedure with patient.  Return to work tomorrow with no heavy lifting over 10 pounds for three months from the date of surgery.  No exercise routine other than light walking.  Follow up around October 01, 2014.   An After Visit Summary was printed and given to the patient.  ___20__ minutes face to face time of which over 50% was spent in counseling.

## 2014-10-01 ENCOUNTER — Encounter: Payer: Self-pay | Admitting: Obstetrics and Gynecology

## 2014-10-01 ENCOUNTER — Ambulatory Visit (INDEPENDENT_AMBULATORY_CARE_PROVIDER_SITE_OTHER): Payer: BC Managed Care – PPO | Admitting: Obstetrics and Gynecology

## 2014-10-01 VITALS — BP 100/64 | HR 70 | Ht 63.0 in | Wt 127.4 lb

## 2014-10-01 DIAGNOSIS — Z9889 Other specified postprocedural states: Secondary | ICD-10-CM

## 2014-10-01 NOTE — Progress Notes (Signed)
Patient ID: Crystal Carr, female   DOB: [birth year], 63 y.o.   MRN: [id number] GYNECOLOGY  VISIT   HPI: 63 y.o.   Divorced  Caucasian  female   G2P2002 with Patient's last menstrual period was 02/09/2002.   here for 3 month post op visit.  Status post total vaginal hysterectomy with anterior and posterior  colporrhaphy, TVT exact mid urethral sling, cystoscopy 07/01/14. Energy improved.  Takes a little longer to void. Not bothersome, but just aware of this.  Voiding well overall. No leakage with cough or laugh.  Normal bowel function.  No vaginal drainage.   Stopped vaginal estrogen cream for surgery.  Used Estrace vaginal cream 1 gram twice a week.  GYNECOLOGIC HISTORY: Patient's last menstrual period was 02/09/2002. Contraception:  Postmenopausal/TVH Menopausal hormone therapy: none        OB History   Grav Para Term Preterm Abortions TAB SAB Ect Mult Living   2 2 2       2          Patient Active Problem List   Diagnosis Date Noted  . S/P vaginal hysterectomy 07/01/2014  . Genuine stress incontinence, female 06/01/2014  . Uterovaginal prolapse, incomplete 06/01/2014    Past Medical History  Diagnosis Date  . Osteopenia   . Thyroid disease     Hyperthyroid--had iodine therapy  . Urinary incontinence     with sneezing, coughing  . Hypothyroidism     Past Surgical History  Procedure Laterality Date  . Cataract extraction Bilateral   . Thoracic outlet surgery    . Vaginal hysterectomy N/A 07/01/2014    Procedure: HYSTERECTOMY VAGINAL;  Surgeon: Jamey Reas de Berton Lan, MD;  Location: [facility];  Service: Gynecology;  Laterality: N/A;  . Bladder suspension N/A 07/01/2014    Procedure:  TRANSVAGINAL TAPE (TVT) PROCEDURE,;  Surgeon: Jamey Reas de Berton Lan, MD;  Location: [facility];  Service: Gynecology;  Laterality: N/A;  . Cystoscopy N/A 07/01/2014    Procedure: CYSTOSCOPY;  Surgeon: Jamey Reas de Berton Lan, MD;  Location: [facility];   Service: Gynecology;  Laterality: N/A;    Current Outpatient Prescriptions  Medication Sig Dispense Refill  . Calcium Citrate-Vitamin D (CVS CALCIUM CITRATE + D PO) Take 600 mg by mouth 2 (two) times daily.      . Cetirizine HCl (ZYRTEC ALLERGY PO) Take by mouth as needed.      . Cholecalciferol (VITAMIN D-3) 1000 UNITS CAPS Take 1,000 Units by mouth daily.       Marland Kitchen desonide (DESOWEN) 0.05 % ointment Apply 1 application topically as needed.      . Fish Oil-Cholecalciferol (FISH OIL + D3) 1000-1000 MG-UNIT CAPS Take 1,000 mg by mouth daily.      Marland Kitchen levothyroxine (SYNTHROID, LEVOTHROID) 25 MCG tablet Take 25 mcg by mouth daily.      . Multiple Vitamin (MULTIVITAMIN WITH MINERALS) TABS tablet Take 1 tablet by mouth daily.      Marland Kitchen PROTOPIC 0.1 % ointment Apply 1 application topically as needed.       No current facility-administered medications for this visit.     ALLERGIES: Almond oil; Cefzil; Fig extract; and Peanuts  Family History  Problem Relation Age of Onset  . Pancreatic cancer Mother     History   Social History  . Marital Status: Divorced    Spouse Name: N/A    Number of Children: N/A  . Years of Education: N/A   Occupational History  .  Not on file.   Social History Main Topics  . Smoking status: Never Smoker   . Smokeless tobacco: Not on file  . Alcohol Use: 0.5 oz/week    1 drink(s) per week  . Drug Use: No  . Sexual Activity: Not Currently    Partners: Male    Birth Control/ Protection: Post-menopausal, Surgical     Comment: TVH   Other Topics Concern  . Not on file   Social History Narrative  . No narrative on file    ROS:  Pertinent items are noted in HPI.  PHYSICAL EXAMINATION:    BP 100/64  Pulse 70  Ht 5\' 3"  (1.6 m)  Wt 127 lb 6.4 oz (57.788 kg)  BMI 22.57 kg/m2  LMP 02/09/2002     General appearance: alert, cooperative and appears stated age  Pelvic: External genitalia:  no lesions              Urethra:  normal appearing urethra with no  masses, tenderness or lesions, mild mucosal urethral prolapse.              Bartholins and Skenes: normal                 Vagina: normal appearing vagina with normal color and discharge, no lesions, excellent support and vaginal length, atrophy noted.               Cervix:  absent                   Bimanual Exam:  Uterus:   absent                                      Adnexa: normal adnexa in size, nontender and no masses                                      ASSESSMENT  Status post total vaginal hysterectomy with anterior and posterior colporrhaphy, TVT exact mid urethral sling, cystoscopy 07/01/14. Vaginal atrophy.   PLAN  Restart Estrace 1/2 - 1 gm per vagina at hs twice weekly. Return to Dr. Alan Ripper gynecologic care.  Patient invited to return to my care for any surgical healing issues or concerns.   An After Visit Summary was printed and given to the patient.  __15____ minutes face to face time of which over 50% was spent in counseling.

## 2014-10-01 NOTE — Patient Instructions (Signed)
Please start using the Estrace vaginal cream 1/2 to 1 gram per vagina at bedtime, twice a week.  Return to Dr. Harrington Challenger for your usual gynecologic care.  I am here if you have any problems with your surgery recovery.

## 2014-10-13 ENCOUNTER — Encounter: Payer: Self-pay | Admitting: Obstetrics and Gynecology

## 2014-11-14 ENCOUNTER — Other Ambulatory Visit: Payer: Self-pay

## 2014-11-14 ENCOUNTER — Other Ambulatory Visit: Payer: Self-pay | Admitting: Family Medicine

## 2014-11-14 DIAGNOSIS — N644 Mastodynia: Secondary | ICD-10-CM

## 2014-11-18 ENCOUNTER — Other Ambulatory Visit: Payer: Self-pay | Admitting: Family Medicine

## 2014-11-18 DIAGNOSIS — N644 Mastodynia: Secondary | ICD-10-CM

## 2014-11-19 ENCOUNTER — Other Ambulatory Visit: Payer: Self-pay | Admitting: Obstetrics and Gynecology

## 2014-11-20 LAB — CYTOLOGY - PAP

## 2014-12-02 ENCOUNTER — Ambulatory Visit
Admission: RE | Admit: 2014-12-02 | Discharge: 2014-12-02 | Disposition: A | Discharge status: Home / Self Care | Payer: BC Managed Care – PPO | Source: Ambulatory Visit | Attending: Family Medicine | Admitting: Family Medicine

## 2014-12-02 DIAGNOSIS — N644 Mastodynia: Secondary | ICD-10-CM

## 2015-08-18 ENCOUNTER — Ambulatory Visit
Admission: RE | Admit: 2015-08-18 | Discharge: 2015-08-18 | Disposition: A | Discharge status: Home / Self Care | Payer: Managed Care, Other (non HMO) | Source: Ambulatory Visit | Attending: Internal Medicine | Admitting: Internal Medicine

## 2015-08-18 ENCOUNTER — Other Ambulatory Visit: Payer: Self-pay | Admitting: Internal Medicine

## 2015-08-18 DIAGNOSIS — M79675 Pain in left toe(s): Secondary | ICD-10-CM

## 2015-11-03 ENCOUNTER — Ambulatory Visit: Payer: Self-pay | Admitting: Obstetrics and Gynecology

## 2015-12-01 ENCOUNTER — Ambulatory Visit (INDEPENDENT_AMBULATORY_CARE_PROVIDER_SITE_OTHER): Payer: Managed Care, Other (non HMO) | Admitting: Obstetrics and Gynecology

## 2015-12-01 ENCOUNTER — Encounter: Payer: Self-pay | Admitting: Obstetrics and Gynecology

## 2015-12-01 VITALS — BP 110/68 | HR 70 | Resp 20 | Ht 62.75 in | Wt 127.0 lb

## 2015-12-01 DIAGNOSIS — Z01419 Encounter for gynecological examination (general) (routine) without abnormal findings: Secondary | ICD-10-CM | POA: Diagnosis not present

## 2015-12-01 DIAGNOSIS — R300 Dysuria: Secondary | ICD-10-CM

## 2015-12-01 DIAGNOSIS — N952 Postmenopausal atrophic vaginitis: Secondary | ICD-10-CM | POA: Diagnosis not present

## 2015-12-01 LAB — POCT URINALYSIS DIPSTICK
BILIRUBIN UA: NEGATIVE
GLUCOSE UA: NEGATIVE
KETONES UA: NEGATIVE
Leukocytes, UA: NEGATIVE
Nitrite, UA: NEGATIVE
PH UA: 6
Protein, UA: NEGATIVE
RBC UA: NEGATIVE
Urobilinogen, UA: NEGATIVE

## 2015-12-01 MED ORDER — ESTROGENS, CONJUGATED 0.625 MG/GM VA CREA: TOPICAL_CREAM | VAGINAL | Status: DC

## 2015-12-01 NOTE — Patient Instructions (Signed)

## 2015-12-01 NOTE — Progress Notes (Signed)
Patient ID: Crystal Carr, female   DOB: 08-27-1951, 64 y.o.   MRN: [id number] 64 y.o. G52P2002 Divorced Caucasian female here for annual exam.    Status post total vaginal hysterectomy with anterior and posterior colporrhaphy, TVT exact mid urethral sling, cystoscopy.  July 2015. Cervical pathology negative for intraepithelial lesion.  No urinary leakage. Does shifting on toilet to void completely. Some urgency just before voiding.  Rare leakage if delays to void.  Can have a stabbing sensation when voids twice a week. DF - depends on coffee usage and water consumption.  Voiding every 3 - 4 hours.  NF - occasionally. Bowel function is normal.   Wants Rx for Estrace vaginal cream.  Previously prescribed by Dr. Gus Height, prior GYN.  PCP:  Lajean Manes, MD  Patient's last menstrual period was 02/09/2002.          Sexually active: No. female The current method of family planning is post menopausal status/TVH.    Exercising: Yes.    aerobics, weights and strength. Smoker:  no  Health Maintenance: Pap:  11-19-14 Ascus:Neg HR HPV. History of abnormal Pap:  Yes,2008 hx of dysplasia with colposcopy but no treatment to cervix. MMG:  12-02-14 Diag.Bil/density cat.D/Neg/BiRads1:[facility]. Colonoscopy:  2008 normal with [facility];next due 2018. BMD:   02/2015  Result  Osteopenia:done with Dr. Felipa Eth TDaP:  Up to date with PCP Screening Labs:  Hb today: PCP, Urine today: negative.   reports that she has never smoked. She does not have any smokeless tobacco history on file. She reports that she drinks about 1.2 oz of alcohol per week. She reports that she does not use illicit drugs.  Past Medical History  Diagnosis Date  . Osteopenia   . Thyroid disease     Hyperthyroid--had iodine therapy  . Urinary incontinence     with sneezing, coughing  . Hypothyroidism     Past Surgical History  Procedure Laterality Date  . Cataract extraction Bilateral   . Thoracic outlet surgery     . Vaginal hysterectomy N/A 07/01/2014    Procedure: HYSTERECTOMY VAGINAL;  Surgeon: Jamey Reas de Berton Lan, MD;  Location: [facility];  Service: Gynecology;  Laterality: N/A;  . Bladder suspension N/A 07/01/2014    Procedure:  TRANSVAGINAL TAPE (TVT) PROCEDURE,;  Surgeon: Jamey Reas de Berton Lan, MD;  Location: [facility];  Service: Gynecology;  Laterality: N/A;  . Cystoscopy N/A 07/01/2014    Procedure: CYSTOSCOPY;  Surgeon: Jamey Reas de Berton Lan, MD;  Location: [facility];  Service: Gynecology;  Laterality: N/A;    Current Outpatient Prescriptions  Medication Sig Dispense Refill  . Calcium Citrate-Vitamin D (CVS CALCIUM CITRATE + D PO) Take 600 mg by mouth 2 (two) times daily.    . Cetirizine HCl (ZYRTEC ALLERGY PO) Take by mouth as needed.    . Cholecalciferol (VITAMIN D-3) 1000 UNITS CAPS Take 1,000 Units by mouth daily.     Marland Kitchen desonide (DESOWEN) 0.05 % ointment Apply 1 application topically as needed.    . Fish Oil-Cholecalciferol (FISH OIL + D3) 1000-1000 MG-UNIT CAPS Take 1,000 mg by mouth daily.    Marland Kitchen levothyroxine (SYNTHROID, LEVOTHROID) 25 MCG tablet Take 25 mcg by mouth daily.    . Multiple Vitamin (MULTIVITAMIN WITH MINERALS) TABS tablet Take 1 tablet by mouth daily.     No current facility-administered medications for this visit.    Family History  Problem Relation Age of Onset  . Pancreatic cancer Mother  ROS:  Pertinent items are noted in HPI.  Otherwise, a comprehensive ROS was negative.  Exam:   BP 110/68 mmHg  Pulse 70  Resp 20  Ht 5' 2.75" (1.594 m)  Wt 127 lb (57.607 kg)  BMI 22.67 kg/m2  LMP 02/09/2002    General appearance: alert, cooperative and appears stated age Head: Normocephalic, without obvious abnormality, atraumatic Neck: no adenopathy, supple, symmetrical, trachea midline and thyroid normal to inspection and palpation Lungs: clear to auscultation bilaterally Breasts: normal appearance, no masses or tenderness,  Inspection negative, No nipple retraction or dimpling, No nipple discharge or bleeding, No axillary or supraclavicular adenopathy Heart: regular rate and rhythm Abdomen: soft, non-tender; bowel sounds normal; no masses,  no organomegaly Extremities: extremities normal, atraumatic, no cyanosis or edema Skin: Skin color, texture, turgor normal. No rashes or lesions Lymph nodes: Cervical, supraclavicular, and axillary nodes normal. No abnormal inguinal nodes palpated Neurologic: Grossly normal  Pelvic: External genitalia:  no lesions              Urethra:  normal appearing urethra with no masses, tenderness or lesions              Bartholins and Skenes: normal                 Vagina: normal appearing vagina with normal color and discharge, no lesions              Cervix: absent              Pap taken: No. Bimanual Exam:  Uterus:  uterus absent              Adnexa: no mass, fullness, tenderness              Rectovaginal: Yes.  .  Confirms.              Anus:  normal sphincter tone, no lesions  Chaperone was present for exam.  Assessment:   Well woman visit with normal exam. Remote hx of cervical dysplasia.  Status post TVH and TVT Exact midurethral sling. Dysuria - intermittent.  Osteopenia.  Followed by PCP.  Vaginal atrophy.   Plan: Yearly mammogram recommended after age 53.  She will schedule. Recommended self breast exam.  Pap and HR HPV as above. Discussed Calcium, Vitamin D, regular exercise program including cardiovascular and weight bearing exercise. Labs performed.  Yes.  .   See orders.  Urine dip.  Will send micro and culture if positive.  Refills given on medications.  Yes.  .  See orders.  Premarin vaginal cream.  Discussed risks of DVT, PE, MI, stroke, breast cancer.  Bone density through PCP. Follow up annually and prn.    After visit summary provided.

## 2015-12-10 ENCOUNTER — Other Ambulatory Visit: Payer: Self-pay

## 2015-12-10 DIAGNOSIS — Z1231 Encounter for screening mammogram for malignant neoplasm of breast: Secondary | ICD-10-CM

## 2016-01-05 ENCOUNTER — Ambulatory Visit: Payer: Managed Care, Other (non HMO)

## 2016-01-13 ENCOUNTER — Ambulatory Visit
Admission: RE | Admit: 2016-01-13 | Discharge: 2016-01-13 | Disposition: A | Discharge status: Home / Self Care | Payer: Managed Care, Other (non HMO) | Source: Ambulatory Visit

## 2016-01-13 DIAGNOSIS — Z1231 Encounter for screening mammogram for malignant neoplasm of breast: Secondary | ICD-10-CM

## 2016-08-22 ENCOUNTER — Other Ambulatory Visit: Payer: Self-pay | Admitting: Gastroenterology

## 2016-10-03 ENCOUNTER — Ambulatory Visit (HOSPITAL_COMMUNITY): Admit: 2016-10-03 | Payer: Managed Care, Other (non HMO) | Admitting: Gastroenterology

## 2016-10-03 ENCOUNTER — Encounter (HOSPITAL_COMMUNITY): Payer: Self-pay

## 2016-10-03 SURGERY — COLONOSCOPY WITH PROPOFOL
Anesthesia: Monitor Anesthesia Care

## 2016-12-01 ENCOUNTER — Ambulatory Visit
Admission: RE | Admit: 2016-12-01 | Discharge: 2016-12-01 | Disposition: A | Discharge status: Home / Self Care | Payer: Managed Care, Other (non HMO) | Source: Ambulatory Visit | Attending: Nurse Practitioner | Admitting: Nurse Practitioner

## 2016-12-01 ENCOUNTER — Other Ambulatory Visit: Payer: Self-pay | Admitting: Nurse Practitioner

## 2016-12-01 DIAGNOSIS — M533 Sacrococcygeal disorders, not elsewhere classified: Secondary | ICD-10-CM

## 2017-01-04 ENCOUNTER — Ambulatory Visit: Payer: Managed Care, Other (non HMO) | Admitting: Obstetrics and Gynecology

## 2017-01-13 NOTE — Progress Notes (Signed)
66 y.o. G46P2002 Divorced Caucasian female here for annual exam.    Not using the Premarin vaginal cream.   Good bladder control.  When she voids, she feels a brief nerve like pain, some of the time.  When voids well but does some position changes.  No leakage of urine.  Normal bowel function.  Hemorrhoid? Using Tucks and Prep H which help.   PCP:   Lajean Manes, MD  Patient's last menstrual period was 02/09/2002.           Sexually active: No.  The current method of family planning is post menopausal status/hysterectomy.    Exercising: Yes.    Jazzercize Smoker:  no  Health Maintenance: Pap:  11-19-14 Ascus:Neg HR HPV History of abnormal Pap:  Yes, ,2008 hx of dysplasia with colposcopy but no treatment to cervix. MMG:  01-13-16 3D Density C/Neg/BiRads1:[facility].  She will call to schedule. Colonoscopy:  2008 normal with [facility];next due 2018.    BMD:   02/2015  Result:Osteopenia with Dr. Felipa Eth.  Did at [facility].  TDaP:  PCP Gardasil:   N/A HIV:  Will do with PCP.  Hep C:  Will do with PCP.  Screening Labs:  Hb today: PCP, Urine today: Neg   reports that she has never smoked. She has never used smokeless tobacco. She reports that she drinks about 1.2 oz of alcohol per week . She reports that she does not use drugs.  Past Medical History:  Diagnosis Date  . Hypothyroidism   . Osteopenia   . Thyroid disease    Hyperthyroid--had iodine therapy  . Urinary incontinence    with sneezing, coughing    Past Surgical History:  Procedure Laterality Date  . BLADDER SUSPENSION N/A 07/01/2014   Procedure:  TRANSVAGINAL TAPE (TVT) PROCEDURE,;  Surgeon: Jamey Reas de Berton Lan, MD;  Location: [facility];  Service: Gynecology;  Laterality: N/A;  . CATARACT EXTRACTION Bilateral   . CYSTOSCOPY N/A 07/01/2014   Procedure: CYSTOSCOPY;  Surgeon: Jamey Reas de Berton Lan, MD;  Location: [facility];  Service: Gynecology;  Laterality: N/A;  . THORACIC OUTLET SURGERY    .  VAGINAL HYSTERECTOMY N/A 07/01/2014   Procedure: HYSTERECTOMY VAGINAL;  Surgeon: Jamey Reas de Berton Lan, MD;  Location: [facility];  Service: Gynecology;  Laterality: N/A;    Current Outpatient Prescriptions  Medication Sig Dispense Refill  . Calcium Citrate-Vitamin D (CVS CALCIUM CITRATE + D PO) Take 600 mg by mouth 2 (two) times daily.    . Cetirizine HCl (ZYRTEC ALLERGY PO) Take by mouth as needed.    . Cholecalciferol (VITAMIN D-3) 1000 UNITS CAPS Take 1,000 Units by mouth daily.     Marland Kitchen conjugated estrogens (PREMARIN) vaginal cream Use 1/2 g vaginally every night at bed time for the first 2 weeks, then use 1/2 g vaginally two times per week. 30 g 6  . desonide (DESOWEN) 0.05 % ointment Apply 1 application topically as needed.    . EUCRISA 2 % OINT Apply 1 application topically as needed.    . Fish Oil-Cholecalciferol (FISH OIL + D3) 1000-1000 MG-UNIT CAPS Take 1,000 mg by mouth daily.    Marland Kitchen levothyroxine (SYNTHROID, LEVOTHROID) 25 MCG tablet Take 25 mcg by mouth daily.    . Multiple Vitamin (MULTIVITAMIN WITH MINERALS) TABS tablet Take 1 tablet by mouth daily.     No current facility-administered medications for this visit.     Family History  Problem Relation Age of Onset  .  Pancreatic cancer Mother     ROS:  Pertinent items are noted in HPI.  Otherwise, a comprehensive ROS was negative.  Exam:   BP 102/64 (BP Location: Right Arm, Patient Position: Sitting, Cuff Size: Normal)   Pulse 70   Resp 16   Ht 5\' 3"  (1.6 m)   Wt 126 lb 6.4 oz (57.3 kg)   LMP 02/09/2002   BMI 22.39 kg/m     General appearance: alert, cooperative and appears stated age Head: Normocephalic, without obvious abnormality, atraumatic Neck: no adenopathy, supple, symmetrical, trachea midline and thyroid normal to inspection and palpation Lungs: clear to auscultation bilaterally Breasts: normal appearance, no masses or tenderness, No nipple retraction or dimpling, No nipple discharge or bleeding, No  axillary or supraclavicular adenopathy Heart: regular rate and rhythm Abdomen: soft, non-tender; no masses, no organomegaly Extremities: extremities normal, atraumatic, no cyanosis or edema Skin: Skin color, texture, turgor normal. No rashes or lesions Lymph nodes: Cervical, supraclavicular, and axillary nodes normal. No abnormal inguinal nodes palpated Neurologic: Grossly normal  Pelvic: External genitalia:  no lesions              Urethra:  normal appearing urethra with no masses, tenderness or lesions              Bartholins and Skenes: normal                 Vagina: normal appearing vagina with normal color and discharge, no lesions              Cervix:  Absent.              Pap taken: No. Bimanual Exam:  Uterus:  Absent.              Adnexa: no mass, fullness, tenderness              Rectal exam: Yes.  .  Confirms.              Anus:  normal sphincter tone, no lesions  Chaperone was present for exam.  Assessment:   Well woman visit with normal exam. Status post TVH/Anterior and Posterior ColporrhaphyTVT/cysto.  Osteopenia.  Vaginal atrophy.   Plan: Mammogram screening discussed. Recommended self breast awareness. Pap and HR HPV as above. Guidelines for Calcium, Vitamin D, regular exercise program including cardiovascular and weight bearing exercise. Switch to Vagifem which is on her formulary.  Discussed potential breast effect.  BMD due at [facility] this year.  She will pursue with PCP.  Coloscopy also due this year at [facility]. Follow up annually and prn.       After visit summary provided.

## 2017-01-16 ENCOUNTER — Encounter: Payer: Self-pay | Admitting: Obstetrics and Gynecology

## 2017-01-16 ENCOUNTER — Ambulatory Visit (INDEPENDENT_AMBULATORY_CARE_PROVIDER_SITE_OTHER): Payer: Managed Care, Other (non HMO) | Admitting: Obstetrics and Gynecology

## 2017-01-16 VITALS — BP 102/64 | HR 70 | Resp 16 | Ht 63.0 in | Wt 126.4 lb

## 2017-01-16 DIAGNOSIS — Z01419 Encounter for gynecological examination (general) (routine) without abnormal findings: Secondary | ICD-10-CM | POA: Diagnosis not present

## 2017-01-16 DIAGNOSIS — Z0000 Encounter for general adult medical examination without abnormal findings: Secondary | ICD-10-CM | POA: Diagnosis not present

## 2017-01-16 LAB — POCT URINALYSIS DIPSTICK
BILIRUBIN UA: NEGATIVE
Blood, UA: NEGATIVE
GLUCOSE UA: NEGATIVE
KETONES UA: NEGATIVE
LEUKOCYTES UA: NEGATIVE
Nitrite, UA: NEGATIVE
Protein, UA: NEGATIVE
Urobilinogen, UA: NEGATIVE
pH, UA: 5

## 2017-01-16 MED ORDER — ESTRADIOL 10 MCG VA TABS: ORAL_TABLET | VAGINAL | 11 refills | Status: DC

## 2017-01-16 NOTE — Patient Instructions (Signed)

## 2017-01-26 ENCOUNTER — Other Ambulatory Visit: Payer: Self-pay | Admitting: Obstetrics and Gynecology

## 2017-01-26 DIAGNOSIS — Z1231 Encounter for screening mammogram for malignant neoplasm of breast: Secondary | ICD-10-CM

## 2017-02-15 ENCOUNTER — Ambulatory Visit
Admission: RE | Admit: 2017-02-15 | Discharge: 2017-02-15 | Disposition: A | Discharge status: Home / Self Care | Payer: Managed Care, Other (non HMO) | Source: Ambulatory Visit | Attending: Obstetrics and Gynecology | Admitting: Obstetrics and Gynecology

## 2017-02-15 DIAGNOSIS — Z1231 Encounter for screening mammogram for malignant neoplasm of breast: Secondary | ICD-10-CM

## 2017-08-18 ENCOUNTER — Telehealth: Payer: Self-pay | Admitting: Obstetrics and Gynecology

## 2017-08-18 NOTE — Telephone Encounter (Signed)
Patient experiencing right breast pain for 3 weeks off and on.

## 2017-08-18 NOTE — Telephone Encounter (Signed)
Spoke with patient. Patient states that she has been experiencing right breast pain off and on for 3 weeks. Pain has become more noticeable over the last few days. Has discomfort when laying on abdomen. Denies any swelling, redness, temperature change, or nipple discharge from the breast. Patient has performed self breast exam and has not noticed any changes. Advised will need to be seen for evaluation. Offered appointment today, but patient declines due to work. Requests early morning or late afternoon appointment next week. Appointment scheduled for 08/22/2017 at 8:30 am with Dr.Miller as Dr.Silva will be out of town. Patient is agreeable to date, time, and to see another MD.  Routing to covering provider for final review. Patient agreeable to disposition. Will close encounter.

## 2017-08-22 ENCOUNTER — Ambulatory Visit (INDEPENDENT_AMBULATORY_CARE_PROVIDER_SITE_OTHER): Payer: PRIVATE HEALTH INSURANCE | Admitting: Obstetrics & Gynecology

## 2017-08-22 ENCOUNTER — Encounter: Payer: Self-pay | Admitting: Obstetrics & Gynecology

## 2017-08-22 VITALS — BP 102/54 | HR 66 | Resp 14 | Wt 123.0 lb

## 2017-08-22 DIAGNOSIS — N644 Mastodynia: Secondary | ICD-10-CM | POA: Diagnosis not present

## 2017-08-22 DIAGNOSIS — N6310 Unspecified lump in the right breast, unspecified quadrant: Secondary | ICD-10-CM

## 2017-08-22 NOTE — Progress Notes (Signed)
GYNECOLOGY  VISIT   HPI: 66 y.o. G22P2002 Divorced Caucasian female here for complaint of right breast pain that started about a month ago.  Denies trauma.  Not doing a lot of exercising right now due to driving to new job in [place].  No skin changes.  No nipple discharge.  Had 3D MMG 3/18.  She is worried about this.  GYNECOLOGIC HISTORY: Patient's last menstrual period was 02/09/2002. Contraception: PMP Menopausal hormone therapy: vagifem  There are no active problems to display for this patient.   Past Medical History:  Diagnosis Date  . Hypothyroidism   . Osteopenia   . Thyroid disease    Hyperthyroid--had iodine therapy  . Urinary incontinence    with sneezing, coughing    Past Surgical History:  Procedure Laterality Date  . BLADDER SUSPENSION N/A 07/01/2014   Procedure:  TRANSVAGINAL TAPE (TVT) PROCEDURE,;  Surgeon: Jamey Reas de Berton Lan, MD;  Location: [facility];  Service: Gynecology;  Laterality: N/A;  . CATARACT EXTRACTION Bilateral   . CYSTOSCOPY N/A 07/01/2014   Procedure: CYSTOSCOPY;  Surgeon: Jamey Reas de Berton Lan, MD;  Location: [facility];  Service: Gynecology;  Laterality: N/A;  . THORACIC OUTLET SURGERY    . VAGINAL HYSTERECTOMY N/A 07/01/2014   Procedure: HYSTERECTOMY VAGINAL;  Surgeon: Jamey Reas de Berton Lan, MD;  Location: [facility];  Service: Gynecology;  Laterality: N/A;    MEDS:   Current Outpatient Prescriptions on File Prior to Visit  Medication Sig Dispense Refill  . Calcium Citrate-Vitamin D (CVS CALCIUM CITRATE + D PO) Take 600 mg by mouth 2 (two) times daily.    . Cetirizine HCl (ZYRTEC ALLERGY PO) Take by mouth as needed.    . Cholecalciferol (VITAMIN D-3) 1000 UNITS CAPS Take 1,000 Units by mouth daily.     Marland Kitchen desonide (DESOWEN) 0.05 % ointment Apply 1 application topically as needed.    . Estradiol 10 MCG TABS vaginal tablet Place one tablet (10 mcg) per vagina at hs for 2 weeks, and then place one tablet in  the vagina at hs twice weekly. 18 tablet 11  . EUCRISA 2 % OINT Apply 1 application topically as needed.    . Fish Oil-Cholecalciferol (FISH OIL + D3) 1000-1000 MG-UNIT CAPS Take 1,000 mg by mouth daily.    Marland Kitchen levothyroxine (SYNTHROID, LEVOTHROID) 25 MCG tablet Take 25 mcg by mouth daily.    . Multiple Vitamin (MULTIVITAMIN WITH MINERALS) TABS tablet Take 1 tablet by mouth daily.     No current facility-administered medications on file prior to visit.     ALLERGIES: Almond oil; Cefzil [cefprozil]; Fig extract [ficus]; and Peanuts [peanut oil]  Family History  Problem Relation Age of Onset  . Pancreatic cancer Mother     SH:  Divorces, non smoker  ROS  PHYSICAL EXAMINATION:    BP (!) 102/54 (BP Location: Right Arm, Patient Position: Sitting, Cuff Size: Normal)   Pulse 66   Resp 14   Wt 123 lb (55.8 kg)   LMP 02/09/2002   BMI 21.79 kg/m     General appearance: alert, cooperative and appears stated age Physical Exam  Constitutional: She appears well-developed and well-nourished.  Neck: Normal range of motion. Neck supple.  Pulmonary/Chest: Right breast exhibits mass and tenderness. Right breast exhibits no inverted nipple, no nipple discharge and no skin change. Left breast exhibits no inverted nipple, no mass, no nipple discharge, no skin change and no tenderness. Breasts are symmetrical.  Chaperone was present for exam.  Assessment: Right breast cyst/nodule  Plan: Diagnostic right breast MMG will be scheduled for pt and follow-up recommendations will be made after that is completed.

## 2017-08-22 NOTE — Progress Notes (Signed)
Scheduled patient for R breast diagnostic mammogram and ultrasound at the [facility] on 08/25/2017 at 8:10 am. Patient is agreeable to date and time. [facility] help the [facility] reschedule if there are office closures due to inclement weather. Place in mammogram hold.

## 2017-08-25 ENCOUNTER — Other Ambulatory Visit: Payer: Managed Care, Other (non HMO)

## 2017-08-30 ENCOUNTER — Ambulatory Visit
Admission: RE | Admit: 2017-08-30 | Discharge: 2017-08-30 | Disposition: A | Discharge status: Home / Self Care | Payer: PRIVATE HEALTH INSURANCE | Source: Ambulatory Visit | Attending: Obstetrics & Gynecology | Admitting: Obstetrics & Gynecology

## 2017-08-30 DIAGNOSIS — N6310 Unspecified lump in the right breast, unspecified quadrant: Secondary | ICD-10-CM

## 2017-09-01 ENCOUNTER — Telehealth: Payer: Self-pay

## 2017-09-01 NOTE — Telephone Encounter (Signed)
-----   Message from Megan Salon, MD sent at 09/01/2017 12:11 AM EDT ----- Please let pt know her mammogram was normal.  Needs to have regular mammogram done in six months.  I would recommend repeat breast check in 4-6 weeks.  Pt typically sees Dr. Quincy Simmonds and I saw pt when she was out of town.  Out of MMG hold.  CC:  Dr. Quincy Simmonds

## 2017-09-01 NOTE — Telephone Encounter (Signed)
Spoke with patient. Advised of results and message as seen below from [place]. Patient verbalizes understanding. Patient will be out of town until beginning of November. Follow up breast check scheduled for 10/18/2017 at 8:15 am with Dr.Silva. Patient is agreeable to date and time.  Cc: Dr.Silva  Routing to provider for final review. Patient agreeable to disposition. Will close encounter.

## 2017-10-16 NOTE — Progress Notes (Signed)
GYNECOLOGY  VISIT   HPI: 66 y.o.   Divorced  Caucasian  female   G2P2002 with Patient's last menstrual period was 02/09/2002.  here for follow up breast exam.    Seen on 08/22/17 for a breast exam by Dr. Sabra Heck for right breast pain.  She was noted to have a right breast mass at the 10:00 position.   She had a dx right mammogram and right breast US which were normal.  She is to have a bilateral screening mammogram in March 2019.  On Vagifem.   She states she has the pain when she is more stressed.  She has started a new job.  Working out to help relieve stress. Area was more sore yesterday but is OK today.   No trauma or insect bites.   Hx right thoracic outlet surgery many years ago.   GYNECOLOGIC HISTORY: Patient's last menstrual period was 02/09/2002. Contraception: Postmenopausal Menopausal hormone therapy:  Vagifem Last mammogram: 02-15-17 Density C/Neg/BiRads1:TBC Last pap smear: 11-19-14 ASCUS:Neg HR HPV        OB History    Gravida Para Term Preterm AB Living   2 2 2     2    SAB TAB Ectopic Multiple Live Births                     There are no active problems to display for this patient.   Past Medical History:  Diagnosis Date  . Hypothyroidism   . Osteopenia   . Thyroid disease    Hyperthyroid--had iodine therapy  . Urinary incontinence    with sneezing, coughing    Past Surgical History:  Procedure Laterality Date  . CATARACT EXTRACTION Bilateral   . THORACIC OUTLET SURGERY      Current Outpatient Medications  Medication Sig Dispense Refill  . Calcium Citrate-Vitamin D (CVS CALCIUM CITRATE + D PO) Take 600 mg by mouth 2 (two) times daily.    . Cetirizine HCl (ZYRTEC ALLERGY PO) Take by mouth as needed.    . Cholecalciferol (VITAMIN D-3) 1000 UNITS CAPS Take 1,000 Units by mouth daily.     Marland Kitchen desonide (DESOWEN) 0.05 % ointment Apply 1 application topically as needed.    . Estradiol 10 MCG TABS vaginal tablet Place one tablet (10 mcg) per vagina at hs  for 2 weeks, and then place one tablet in the vagina at hs twice weekly. 18 tablet 11  . EUCRISA 2 % OINT Apply 1 application topically as needed.    . Fish Oil-Cholecalciferol (FISH OIL + D3) 1000-1000 MG-UNIT CAPS Take 1,000 mg by mouth daily.    Marland Kitchen levothyroxine (SYNTHROID, LEVOTHROID) 25 MCG tablet Take 25 mcg by mouth daily.    . Multiple Vitamin (MULTIVITAMIN WITH MINERALS) TABS tablet Take 1 tablet by mouth daily.    . NON FORMULARY Med Name: Triamcinolone 0.1% cream 1 part to Silvadene 1% cream 3 parts. Apply to rough areas of face daily as needed.     No current facility-administered medications for this visit.      ALLERGIES: Almond oil; Cefzil [cefprozil]; Fig extract [ficus]; and Peanuts [peanut oil]  Family History  Problem Relation Age of Onset  . Pancreatic cancer Mother     Social History   Socioeconomic History  . Marital status: Divorced    Spouse name: Not on file  . Number of children: Not on file  . Years of education: Not on file  . Highest education level: Not on file  Social Needs  .  Financial resource strain: Not on file  . Food insecurity - worry: Not on file  . Food insecurity - inability: Not on file  . Transportation needs - medical: Not on file  . Transportation needs - non-medical: Not on file  Occupational History  . Not on file  Tobacco Use  . Smoking status: Never Smoker  . Smokeless tobacco: Never Used  Substance and Sexual Activity  . Alcohol use: Yes    Alcohol/week: 1.2 oz    Types: 2 Standard drinks or equivalent per week    Comment: 1-2 drinks per month  . Drug use: No  . Sexual activity: Not Currently    Partners: Male    Birth control/protection: Post-menopausal, Surgical    Comment: TVH  Other Topics Concern  . Not on file  Social History Narrative  . Not on file    ROS:  Pertinent items are noted in HPI.  PHYSICAL EXAMINATION:    BP 94/60 (BP Location: Right Arm, Patient Position: Sitting, Cuff Size: Normal)   Pulse  68   Resp 16   Ht 5\' 3"  (1.6 m)   Wt 124 lb (56.2 kg)   LMP 02/09/2002   BMI 21.97 kg/m     General appearance: alert, cooperative and appears stated age   Breasts: normal appearance, no dominant masses or tenderness, No nipple retraction or dimpling, No nipple discharge or bleeding, No axillary or supraclavicular adenopathy   Chaperone was present for exam.  ASSESSMENT  Right breast pain and mass on exam with Dr. Sabra Heck. Normal radiologic exams and normal clinical exam today.  PLAN  Screening mammogram in March 2019.  Ok to continue Vagifem.  Ibuprofen 600 mg po q 6 hours prn.  Follow up for annual exam in February 2019.    An After Visit Summary was printed and given to the patient.  15______ minutes face to face time of which over 50% was spent in counseling.

## 2017-10-18 ENCOUNTER — Encounter: Payer: Self-pay | Admitting: Obstetrics and Gynecology

## 2017-10-18 ENCOUNTER — Ambulatory Visit: Payer: PRIVATE HEALTH INSURANCE | Admitting: Obstetrics and Gynecology

## 2017-10-18 VITALS — BP 94/60 | HR 68 | Resp 16 | Ht 63.0 in | Wt 124.0 lb

## 2017-10-18 DIAGNOSIS — N644 Mastodynia: Secondary | ICD-10-CM

## 2018-01-26 ENCOUNTER — Ambulatory Visit: Payer: Managed Care, Other (non HMO) | Admitting: Obstetrics and Gynecology

## 2018-02-12 ENCOUNTER — Encounter: Payer: Self-pay | Admitting: Obstetrics and Gynecology

## 2018-02-12 ENCOUNTER — Ambulatory Visit: Payer: PRIVATE HEALTH INSURANCE | Admitting: Obstetrics and Gynecology

## 2018-02-12 ENCOUNTER — Other Ambulatory Visit: Payer: Self-pay

## 2018-02-12 VITALS — BP 110/62 | HR 72 | Resp 16 | Ht 63.5 in | Wt 126.0 lb

## 2018-02-12 DIAGNOSIS — M8580 Other specified disorders of bone density and structure, unspecified site: Secondary | ICD-10-CM

## 2018-02-12 DIAGNOSIS — Z780 Asymptomatic menopausal state: Secondary | ICD-10-CM

## 2018-02-12 DIAGNOSIS — Z01419 Encounter for gynecological examination (general) (routine) without abnormal findings: Secondary | ICD-10-CM | POA: Diagnosis not present

## 2018-02-12 MED ORDER — ESTRADIOL 10 MCG VA TABS: ORAL_TABLET | VAGINAL | 11 refills | Status: DC

## 2018-02-12 NOTE — Patient Instructions (Signed)

## 2018-02-12 NOTE — Progress Notes (Signed)
67 y.o. G68P2002 Divorced Caucasian female here for annual exam.    Stopped using Vagifem, but wants to restart it.   No problems with bladder function.   Is retiring.  May take care of her aunts.  PCP:   Dr. Felipa Eth  Patient's last menstrual period was 02/09/2002.           Sexually active: No.  The current method of family planning is status post hysterectomy and post menopausal status.    Exercising: Yes.    walking, yoga, aerobics Smoker:  no  Health Maintenance: Pap:   11-19-14 Ascus:Neg HR HPV.  Final pathology for uterus/cervix 2015 - no dysplasia or atypia. History of abnormal Pap:  Yes, ,2008 hx of dysplasia with colposcopy but no treatment to cervix MMG:  08/30/17 Right Breast Diagnostic MM w/ [place] -- BIRADS 1 negative/density c  02/15/17 BIRADS 1 negative/density c Colonoscopy:  2008 normal with [facility];next due 2018.  She will schedule. BMD:   02/2015  Result  Osteopenia.  [facility].  TDaP:  2010 Gardasil:   n/a HIV: never Hep C: never Screening Labs:  PCP   reports that  has never smoked. she has never used smokeless tobacco. She reports that she drinks about 1.2 oz of alcohol per week. She reports that she does not use drugs.  Past Medical History:  Diagnosis Date  . Hypothyroidism   . Osteopenia   . Thyroid disease    Hyperthyroid--had iodine therapy  . Urinary incontinence    with sneezing, coughing    Past Surgical History:  Procedure Laterality Date  . BLADDER SUSPENSION N/A 07/01/2014   Procedure:  TRANSVAGINAL TAPE (TVT) PROCEDURE,;  Surgeon: Jamey Reas de Berton Lan, MD;  Location: [facility];  Service: Gynecology;  Laterality: N/A;  . CATARACT EXTRACTION Bilateral   . CYSTOSCOPY N/A 07/01/2014   Procedure: CYSTOSCOPY;  Surgeon: Jamey Reas de Berton Lan, MD;  Location: [facility];  Service: Gynecology;  Laterality: N/A;  . THORACIC OUTLET SURGERY    . VAGINAL HYSTERECTOMY N/A 07/01/2014   Procedure: HYSTERECTOMY VAGINAL;   Surgeon: Jamey Reas de Berton Lan, MD;  Location: [facility];  Service: Gynecology;  Laterality: N/A;    Current Outpatient Medications  Medication Sig Dispense Refill  . Calcium Citrate-Vitamin D (CVS CALCIUM CITRATE + D PO) Take 600 mg by mouth 2 (two) times daily.    . Cetirizine HCl (ZYRTEC ALLERGY PO) Take by mouth as needed.    . Cholecalciferol (VITAMIN D-3) 1000 UNITS CAPS Take 1,000 Units by mouth daily.     . Estradiol 10 MCG TABS vaginal tablet Place one tablet (10 mcg) per vagina at hs for 2 weeks, and then place one tablet in the vagina at hs twice weekly. 18 tablet 11  . Fish Oil-Cholecalciferol (FISH OIL + D3) 1000-1000 MG-UNIT CAPS Take 1,000 mg by mouth daily.    Marland Kitchen levothyroxine (SYNTHROID, LEVOTHROID) 25 MCG tablet Take 25 mcg by mouth daily.    . Multiple Vitamin (MULTIVITAMIN WITH MINERALS) TABS tablet Take 1 tablet by mouth daily.    . NON FORMULARY Med Name: Triamcinolone 0.1% cream 1 part to Silvadene 1% cream 3 parts. Apply to rough areas of face daily as needed.     No current facility-administered medications for this visit.     Family History  Problem Relation Age of Onset  . Pancreatic cancer Mother     ROS:  Pertinent items are noted in HPI.  Otherwise, a  comprehensive ROS was negative.  Exam:   BP 110/62 (BP Location: Right Arm, Patient Position: Sitting, Cuff Size: Normal)   Pulse 72   Resp 16   Ht 5' 3.5" (1.613 m)   Wt 126 lb (57.2 kg)   LMP 02/09/2002   BMI 21.97 kg/m     General appearance: alert, cooperative and appears stated age Head: Normocephalic, without obvious abnormality, atraumatic Neck: no adenopathy, supple, symmetrical, trachea midline and thyroid normal to inspection and palpation Lungs: clear to auscultation bilaterally Breasts: normal appearance, no masses or tenderness, No nipple retraction or dimpling, No nipple discharge or bleeding, No axillary or supraclavicular adenopathy Heart: regular rate and rhythm Abdomen:  soft, non-tender; no masses, no organomegaly Extremities: extremities normal, atraumatic, no cyanosis or edema Skin: Skin color, texture, turgor normal. No rashes or lesions Lymph nodes: Cervical, supraclavicular, and axillary nodes normal. No abnormal inguinal nodes palpated Neurologic: Grossly normal  Pelvic: External genitalia:  no lesions              Urethra:  normal appearing urethra with no masses, tenderness or lesions              Bartholins and Skenes: normal                 Vagina: normal appearing vagina with normal color and discharge, no lesions, atrophy noted.              Cervix:  Absent.              Pap taken: No. Bimanual Exam:  Uterus:   Absent.              Adnexa: no mass, fullness, tenderness              Rectal exam: Yes.  .  Confirms.              Anus:  normal sphincter tone, no lesions  Chaperone was present for exam.  Assessment:   Well woman visit with normal exam. Status post TVH/Anterior and Posterior ColporrhaphyTVT/cysto.  Ovaries remain.  Osteopenia.  Vaginal atrophy.   Plan: She will schedule mammogram and BMD at [facility]. Recommended self breast awareness. Pap and HR HPV as above. Guidelines for Calcium, Vitamin D, regular exercise program including cardiovascular and weight bearing exercise. Refill of Vagifem for one year.  Discussed effect on breast cancer.  Follow up annually and prn.    After visit summary provided.

## 2018-02-14 ENCOUNTER — Other Ambulatory Visit: Payer: Self-pay | Admitting: Obstetrics and Gynecology

## 2018-02-14 DIAGNOSIS — Z1231 Encounter for screening mammogram for malignant neoplasm of breast: Secondary | ICD-10-CM

## 2018-03-08 ENCOUNTER — Ambulatory Visit
Admission: RE | Admit: 2018-03-08 | Discharge: 2018-03-08 | Disposition: A | Discharge status: Home / Self Care | Payer: PRIVATE HEALTH INSURANCE | Source: Ambulatory Visit | Attending: Obstetrics and Gynecology | Admitting: Obstetrics and Gynecology

## 2018-03-08 ENCOUNTER — Other Ambulatory Visit: Payer: PRIVATE HEALTH INSURANCE

## 2018-03-08 DIAGNOSIS — Z1231 Encounter for screening mammogram for malignant neoplasm of breast: Secondary | ICD-10-CM

## 2018-04-25 DIAGNOSIS — R21 Rash and other nonspecific skin eruption: Secondary | ICD-10-CM | POA: Diagnosis not present

## 2018-04-25 DIAGNOSIS — H1013 Acute atopic conjunctivitis, bilateral: Secondary | ICD-10-CM | POA: Diagnosis not present

## 2018-05-14 DIAGNOSIS — L258 Unspecified contact dermatitis due to other agents: Secondary | ICD-10-CM | POA: Diagnosis not present

## 2018-07-25 ENCOUNTER — Telehealth: Payer: Self-pay | Admitting: Genetics

## 2018-07-25 ENCOUNTER — Encounter: Payer: Self-pay | Admitting: Genetics

## 2018-07-25 NOTE — Telephone Encounter (Signed)
New genetic counseling referral received from Dr. Benay Spice for the pt. Pt has been scheduled to see Ferol Luz on 9/16 at 2pm. Pt has an aunt that sees Dr. Benay Spice. Pt agreed to the appt date and time. Letter mailed.

## 2018-07-27 DIAGNOSIS — K5730 Diverticulosis of large intestine without perforation or abscess without bleeding: Secondary | ICD-10-CM | POA: Diagnosis not present

## 2018-07-27 DIAGNOSIS — D126 Benign neoplasm of colon, unspecified: Secondary | ICD-10-CM | POA: Diagnosis not present

## 2018-07-27 DIAGNOSIS — Z1211 Encounter for screening for malignant neoplasm of colon: Secondary | ICD-10-CM | POA: Diagnosis not present

## 2018-07-31 DIAGNOSIS — D126 Benign neoplasm of colon, unspecified: Secondary | ICD-10-CM | POA: Diagnosis not present

## 2018-08-09 DIAGNOSIS — R69 Illness, unspecified: Secondary | ICD-10-CM | POA: Diagnosis not present

## 2018-08-27 ENCOUNTER — Encounter: Payer: PRIVATE HEALTH INSURANCE | Admitting: Genetic Counselor

## 2018-08-27 ENCOUNTER — Inpatient Hospital Stay: Payer: Medicare HMO

## 2018-08-27 ENCOUNTER — Inpatient Hospital Stay: Payer: Medicare HMO | Attending: Genetic Counselor | Admitting: Genetics

## 2018-08-27 ENCOUNTER — Encounter: Payer: Self-pay | Admitting: Genetics

## 2018-08-27 DIAGNOSIS — Z800 Family history of malignant neoplasm of digestive organs: Secondary | ICD-10-CM

## 2018-08-27 NOTE — Progress Notes (Signed)
REFERRING PROVIDER: Ladell Pier, MD [address] [place], [place] [address]  PRIMARY PROVIDER:  Lajean Manes, MD  PRIMARY REASON FOR VISIT:  1. Family history of pancreatic cancer   2. Family history of colon cancer    HISTORY OF PRESENT ILLNESS:   Crystal Carr, a 67 y.o. female, was seen for a [facility] cancer genetics consultation at the request of Dr. Benay Spice (her aunt's oncologist) due to a family history of cancer.  Crystal Carr presents to clinic today to discuss the possibility of a hereditary predisposition to cancer, genetic testing, and to further clarify her future cancer risks, as well as potential cancer risks for family members.   Crystal Carr is a 67 y.o. female with no personal history of cancer.    CANCER HISTORY:   No history exists.   HORMONAL RISK FACTORS:  Menarche was at age 6.  First live birth at age 37.  OCP use yes years.  Ovaries intact: yes.  Hysterectomy: yes.  Menopausal status: postmenopausal.  LMP 2003 HRT use:  Years. Vaginal pill and vaginal ointment Colonoscopy: yes; 1 adenoma 73m per pt. report in 2019, told to return in 5 years. Mammogram within the last year: yes. Number of breast biopsies: 0.  Past Medical History:  Diagnosis Date  . Family history of colon cancer   . Family history of pancreatic cancer   . Hypothyroidism   . Osteopenia   . Thyroid disease    Hyperthyroid--had iodine therapy  . Urinary incontinence    with sneezing, coughing    Past Surgical History:  Procedure Laterality Date  . BLADDER SUSPENSION N/A 07/01/2014   Procedure:  TRANSVAGINAL TAPE (TVT) PROCEDURE,;  Surgeon: BJamey Reasde CBerton Lan MD;  [facility];  Service: Gynecology;  Laterality: N/A;  . CATARACT EXTRACTION Bilateral   . CYSTOSCOPY N/A 07/01/2014   Procedure: CYSTOSCOPY;  Surgeon: BJamey Reasde CBerton Lan MD;  [facility];  Service: Gynecology;  Laterality: N/A;  . THORACIC OUTLET  SURGERY    . VAGINAL HYSTERECTOMY N/A 07/01/2014   Procedure: HYSTERECTOMY VAGINAL;  Surgeon: BJamey Reasde CBerton Lan MD;  [facility];  Service: Gynecology;  Laterality: N/A;    Social History   Socioeconomic History  . Marital status: Divorced    Spouse name: Not on file  . Number of children: Not on file  . Years of education: Not on file  . Highest education level: Not on file  Occupational History  . Not on file  Social Needs  . Financial resource strain: Not on file  . Food insecurity:    Worry: Not on file    Inability: Not on file  . Transportation needs:    Medical: Not on file    Non-medical: Not on file  Tobacco Use  . Smoking status: Never Smoker  . Smokeless tobacco: Never Used  Substance and Sexual Activity  . Alcohol use: Yes    Alcohol/week: 2.0 standard drinks    Types: 2 Standard drinks or equivalent per week    Comment: 1-2 drinks per month  . Drug use: No  . Sexual activity: Not Currently    Partners: Male    Birth control/protection: Post-menopausal, Surgical    Comment: TVH  Lifestyle  . Physical activity:    Days per week: Not on file    Minutes per session: Not on file  . Stress: Not on file  Relationships  . Social connections:  Talks on phone: Not on file    Gets together: Not on file    Attends religious service: Not on file    Active member of club or organization: Not on file    Attends meetings of clubs or organizations: Not on file    Relationship status: Not on file  Other Topics Concern  . Not on file  Social History Narrative  . Not on file     FAMILY HISTORY:  We obtained a detailed, 4-generation family history.  Significant diagnoses are listed below: Family History  Problem Relation Age of Onset  . Pancreatic cancer Mother 84  . Cancer Maternal Aunt 95       large intestine  . Testicular cancer Maternal Uncle   . Other Maternal Grandfather 65       intestien issues- unk if cancer  . Cancer  Maternal Uncle        leg- unk if bone or soft tissue?  . Cancer Cousin        knee- bone cancer?    Ms.  has a daughter who is 55 and a son who is 48 with no history of cancer.  She has no grandchildren.   Crystal Carr father: died at 27, no history of cancer.  Paternal Aunts/Uncles: 6 paternal aunts/uncles, no history of cancer.  Paternal cousins: 1 paternal cousin died of cancer in the knee? Bone cancer? Paternal grandfather: died due to trauma Paternal grandmother:no history of cancer.  Crystal Carr mother: died of pancreatic cancer dx at 63 Maternal Aunts/Uncles: 70 maternal aunts/uncles, 1 maternal aunt dx with large intestine cancer in her 50's,  1 maternal uncle dx with testicular cancer, 1 maternal uncle with cancer in the leg-unk if bone or soft tissue Maternal cousins: no history of cancer Maternal grandfather: died in his 31's- intestinal issues, unk if cancer? Maternal grandmother:died in her 49's with no history of cancer.  Crystal Carr is unaware of previous family history of genetic testing for hereditary cancer risks. Patient's maternal ancestors are of [place] descent, and paternal ancestors are of [place] descent. There is no reported Ashkenazi Jewish ancestry. There is no known consanguinity.  GENETIC COUNSELING ASSESSMENT: Crystal Carr is a 66 y.o. female with a family history which is somewhat suggestive of a Hereditary Cancer Predisposition Syndrome. We, therefore, discussed and recommended the following at today's visit.   DISCUSSION: We reviewed the characteristics, features and inheritance patterns of hereditary cancer syndromes. We also discussed genetic testing, including the appropriate family members to test, the process of testing, insurance coverage and turn-around-time for results. We discussed the implications of a negative, positive and/or variant of uncertain significant result. We recommended [name] pursue genetic testing for the  Common Hereditary Cancers gene panel + Sarcoma Panel:  The Common Hereditary Cancer Panel offered by [facility] includes sequencing and/or deletion duplication testing of the following 55 genes: APC, ATM, AXIN2, BARD1, BLM, BMPR1A, BRCA1, BRCA2, BRIP1, BUB1B, CDH1, CDK4, CDKN2A, CEP57, CHEK2, CTNNA1, DICER1, ENG, EPCAM, GALNT12, GREM1, HOXB13, KIT, MEN1, MLH1, MLH3, MSH2, MSH3, MSH6, MUTYH, NBN, NF1, NTHL1, PALB2, PDGFRA, PMS2, POLD1, POLE, PTEN, RAD50, RAD51C, RAD51D, RNF43, RPS20, SDHA, SDHB, SDHC, SDHD, SMAD4, SMARCA4, STK11, TP53, TSC1, TSC2, VHL  [facility] Sarcoma Panel: APC BLM CDKN1C DICER1 EPCAM EXT1 EXT2 FH HRAS KIT MLH1 MSH2 MSH6 NBN NF1 PDGFRA PMS2 PRKAR1A PTCH1 RB1 RECQL4 SDHA SDHB SDHC SDHD SUFU TP53 WRN  We discussed that only 5-10% of cancers are associated with a Hereditary Cancer Predisposition Syndrome.  The most  common hereditary cancer syndrome associated with colon cancer is Lynch Syndrome.  Lynch Syndrome is caused by mutations in the genes: MLH1, MSH2, MSH6, PMS2 and EPCAM.  This syndrome increases the risk for colon, uterine, ovarian and stomach cancers, as well as others.  Families with Lynch Syndrome tend to have multiple family members with these cancers, typically diagnosed under age 65, and diagnoses in multiple generations.    We discussed that there are several other genes that are associated with an increased risk for colon cancer and increased polyp burden (MUTYH, APC, POLE, CHEK2, etc.) We also dicussed that there are many genes that cause many different types of cancer risks.    We discussed that if she is found to have a mutation in one of these genes, it may impact future medical management recommendations such as increased cancer screenings and consideration of risk reducing surgeries.  A positive result could also have implications for the patient's family members.  A Negative result would mean we were unable to identify a hereditary component to her cancer, but does not  rule out the possibility of a hereditary basis for her cancer.  There could be mutations that are undetectable by current technology, or in genes not yet tested or identified to increase cancer risk.    We discussed the potential to find a Variant of Uncertain Significance or VUS.  These are variants that have not yet been identified as pathogenic or benign, and it is unknown if this variant is associated with increased cancer risk or if this is a normal finding.  Most VUS's are reclassified to benign or likely benign.   It should not be used to make medical management decisions. With time, we suspect the lab will determine the significance of any VUS's identified if any.   Based on Ms. Ohlson's family history of cancer, she meets NCCN medical criteria for genetic testing. Despite that she meets criteria, she may still have an out of pocket cost. The laboratory can provide an estimate of her OOP cost.  hospital was provided the contact information for the laboratory if hospital has further questions.   We discussed that some people do not want to undergo genetic testing due to fear of genetic discrimination.  A federal law called the Genetic Information Non-Discrimination Act (GINA) of 2008 helps protect individuals against genetic discrimination based on their genetic test results.  It impacts both health insurance and employment.  For health insurance, it protects against increased premiums, being kicked off insurance or being forced to take a test in order to be insured.  For employment it protects against hiring, firing and promoting decisions based on genetic test results.  Health status due to a cancer diagnosis is not protected under GINA.  This law does not protect life insurance, disability insurance, or other types of insurance.   PLAN: Despite our recommendation, Ms. Scaff did not wish to pursue genetic testing at today's visit. We understand this decision, and remain available to  coordinate genetic testing at any time in the future. We; therefore, recommend Ms. Slauson continue to follow the cancer screening guidelines given by her primary healthcare provider.  Based on Ms. Billinger's family history, we recommended her maternal relatives also, have genetic counseling and testing. Ms. Kotas will let us know if we can be of any assistance in coordinating genetic counseling and/or testing for this family member.   Lastly, we encouraged Ms. Freimark to remain in contact with cancer genetics annually so that we can  continuously update the family history and inform her of any changes in cancer genetics and testing that may be of benefit for this family.   Ms.  Ade questions were answered to her satisfaction today. Our contact information was provided should additional questions or concerns arise. Thank you for the referral and allowing [place] to share in the care of your patient.   Tana Felts, [place] Certified Genetic Counselor [email or web address] phone: [phone]  The patient was seen for a total of 40 minutes in face-to-face genetic counseling. Genetic counseling intern Melvenia Beam was present and observed this session.

## 2018-09-03 ENCOUNTER — Telehealth: Payer: Self-pay | Admitting: Obstetrics and Gynecology

## 2018-09-03 NOTE — Telephone Encounter (Signed)
Patient called requesting an appointment for continued right breast pain.  Last seen: 02/12/18

## 2018-09-03 NOTE — Telephone Encounter (Signed)
Spoke with patient. Reports intermittent right breast pain for the last year. Describes as a nagging ache, has lasted for 2 wks this time. Denies any skin changes, lumps or nipple d/c. Right breast is larger than left, when left has been larger in the past. Not taking anything for pain. Requesting OV.  OV scheduled for 9/25 at 3:30pm with Dr. Quincy Simmonds. Recommended motrin 600 mg po q 6hrs prn for pain until OV.   Routing to provider for final review. Patient is agreeable to disposition. Will close encounter.

## 2018-09-05 ENCOUNTER — Ambulatory Visit (INDEPENDENT_AMBULATORY_CARE_PROVIDER_SITE_OTHER): Payer: Medicare HMO | Admitting: Obstetrics and Gynecology

## 2018-09-05 VITALS — BP 110/60 | HR 72 | Resp 14 | Ht 63.0 in | Wt 121.2 lb

## 2018-09-05 DIAGNOSIS — N6310 Unspecified lump in the right breast, unspecified quadrant: Secondary | ICD-10-CM

## 2018-09-05 DIAGNOSIS — N644 Mastodynia: Secondary | ICD-10-CM

## 2018-09-05 NOTE — Progress Notes (Signed)
Patient scheduled while in office for Dx right breast MMG and [place], if needed, at [facility] on 09/07/18 at 2:10pm, arriving at 1:10pm. Patient verbalizes understanding and is agreeable.

## 2018-09-05 NOTE — Progress Notes (Signed)
GYNECOLOGY  VISIT   HPI: 67 y.o.   Divorced  Hispanic  female   [phone] with Patient's last menstrual period was 02/09/2002.   here for   Right breast pain present for 2-3 weeks.  Has an aching on the right lateral side and goes toward the midline.  This is a recurrent issue. She was evaluated for this and a right breast mass by Dr. Sabra Heck on 08/22/17 last year in my absence. She had a dx mammogram and right breast US which were normal. Fu screening mammogram in March 2019 - BI-RADS1.  Did some heavy lifting when she was caring for her aunt.   Not using vaginal estrogen tabs very regularly.   Stress due to recent stroke of her daughter.   Hx of thoracic outlet surgery in the past.  Feels like her arm feels a little funny at times.   No rash.   GYNECOLOGIC HISTORY: Patient's last menstrual period was 02/09/2002. Contraception:  Postmenopausal  Menopausal hormone therapy:  Vagifem Last mammogram:  03/08/18 density C Bi-rads 1 neg  Last pap smear:   11/19/14 Ascus: neg hr Hpv         OB History    Gravida  2   Para  2   Term  2   Preterm      AB      Living  2     SAB      TAB      Ectopic      Multiple      Live Births                 Patient Active Problem List   Diagnosis Date Noted  . Family history of pancreatic cancer   . Family history of colon cancer     Past Medical History:  Diagnosis Date  . Family history of colon cancer   . Family history of pancreatic cancer   . Hypothyroidism   . Osteopenia   . Thyroid disease    Hyperthyroid--had iodine therapy  . Urinary incontinence    with sneezing, coughing    Past Surgical History:  Procedure Laterality Date  . BLADDER SUSPENSION N/A 07/01/2014   Procedure:  TRANSVAGINAL TAPE (TVT) PROCEDURE,;  Surgeon: Jamey Reas de Berton Lan, MD;  Location: [facility];  Service: Gynecology;  Laterality: N/A;  . CATARACT EXTRACTION Bilateral   . CYSTOSCOPY N/A 07/01/2014   Procedure:  CYSTOSCOPY;  Surgeon: Jamey Reas de Berton Lan, MD;  Location: [facility];  Service: Gynecology;  Laterality: N/A;  . THORACIC OUTLET SURGERY    . VAGINAL HYSTERECTOMY N/A 07/01/2014   Procedure: HYSTERECTOMY VAGINAL;  Surgeon: Jamey Reas de Berton Lan, MD;  Location: [facility];  Service: Gynecology;  Laterality: N/A;    Current Outpatient Medications  Medication Sig Dispense Refill  . Calcium Citrate-Vitamin D ([facility] CALCIUM CITRATE + D PO) Take 600 mg by mouth 2 (two) times daily.    . Cetirizine HCl (ZYRTEC ALLERGY PO) Take by mouth as needed.    . Cholecalciferol (VITAMIN D-3) 1000 UNITS CAPS Take 1,000 Units by mouth daily.     . Estradiol 10 MCG TABS vaginal tablet Place one tablet (10 mcg) per vagina at hs for 2 weeks, and then place one tablet in the vagina at hs twice weekly. 18 tablet 11  . Fish Oil-Cholecalciferol (FISH OIL + D3) 1000-1000 MG-UNIT CAPS Take 1,000 mg by mouth daily.    Marland Kitchen levothyroxine (SYNTHROID,  LEVOTHROID) 25 MCG tablet Take 25 mcg by mouth daily.    . Multiple Vitamin (MULTIVITAMIN WITH MINERALS) TABS tablet Take 1 tablet by mouth daily.    . NON FORMULARY Med Name: Triamcinolone 0.1% cream 1 part to Silvadene 1% cream 3 parts. Apply to rough areas of face daily as needed.     No current facility-administered medications for this visit.      ALLERGIES: Almond oil; Cefzil [cefprozil]; Fig extract [ficus]; and Peanuts [peanut oil]  Family History  Problem Relation Age of Onset  . Pancreatic cancer Mother 88  . Cancer Maternal Aunt 95       large intestine  . Testicular cancer Maternal Uncle   . Other Maternal Grandfather 65       intestien issues- unk if cancer  . Cancer Maternal Uncle        leg- unk if bone or soft tissue?  . Cancer Cousin        knee- bone cancer?    Social History   Socioeconomic History  . Marital status: Divorced    Spouse name: Not on file  . Number of children: Not on file  . Years of education: Not on file   . Highest education level: Not on file  Occupational History  . Not on file  Social Needs  . Financial resource strain: Not on file  . Food insecurity:    Worry: Not on file    Inability: Not on file  . Transportation needs:    Medical: Not on file    Non-medical: Not on file  Tobacco Use  . Smoking status: Never Smoker  . Smokeless tobacco: Never Used  Substance and Sexual Activity  . Alcohol use: Yes    Alcohol/week: 2.0 standard drinks    Types: 2 Standard drinks or equivalent per week    Comment: 1-2 drinks per month  . Drug use: No  . Sexual activity: Not Currently    Partners: Male    Birth control/protection: Post-menopausal, Surgical    Comment: TVH  Lifestyle  . Physical activity:    Days per week: Not on file    Minutes per session: Not on file  . Stress: Not on file  Relationships  . Social connections:    Talks on phone: Not on file    Gets together: Not on file    Attends religious service: Not on file    Active member of club or organization: Not on file    Attends meetings of clubs or organizations: Not on file    Relationship status: Not on file  . Intimate partner violence:    Fear of current or ex partner: Not on file    Emotionally abused: Not on file    Physically abused: Not on file    Forced sexual activity: Not on file  Other Topics Concern  . Not on file  Social History Narrative  . Not on file    Review of Systems  Genitourinary:       Right breast pain     PHYSICAL EXAMINATION:    BP 110/60   Pulse 72   Resp 14   Ht 5\' 3"  (1.6 m)   Wt 121 lb 3.2 oz (55 kg)   LMP 02/09/2002   BMI 21.47 kg/m     General appearance: alert, cooperative and appears stated age   Breasts: left - normal appearance, no masses or tenderness, no retractions, No nipple discharge or bleeding, No axillary adenopathy  Right- normal appearance, mass 1 cm at 10:00.  No retractions, nipple masses, or axillary adenopathy.   Chaperone was present for  exam.  ASSESSMENT  Right breast mass.  This may be the same as detected last fall.  Right breast pain.  Hx thoracic outlet surgery. Situational stress due to daughter's recent stroke.  PLAN  Dx right mammogram and [place].  Support given. FU prn.    An After Visit Summary was printed and given to the patient.  __15____ minutes face to face time of which over 50% was spent in counseling.

## 2018-09-07 ENCOUNTER — Ambulatory Visit
Admission: RE | Admit: 2018-09-07 | Discharge: 2018-09-07 | Disposition: A | Discharge status: Home / Self Care | Payer: Medicare HMO | Source: Ambulatory Visit | Attending: Obstetrics and Gynecology | Admitting: Obstetrics and Gynecology

## 2018-09-07 DIAGNOSIS — N6310 Unspecified lump in the right breast, unspecified quadrant: Secondary | ICD-10-CM

## 2018-09-07 DIAGNOSIS — N644 Mastodynia: Secondary | ICD-10-CM

## 2018-09-07 DIAGNOSIS — R928 Other abnormal and inconclusive findings on diagnostic imaging of breast: Secondary | ICD-10-CM | POA: Diagnosis not present

## 2018-09-08 ENCOUNTER — Encounter: Payer: Self-pay | Admitting: Obstetrics and Gynecology

## 2018-10-18 ENCOUNTER — Telehealth: Payer: Self-pay | Admitting: Obstetrics and Gynecology

## 2018-10-18 NOTE — Telephone Encounter (Signed)
Left message on voicemail to call and reschedule cancelled appointment. °

## 2018-11-05 DIAGNOSIS — R69 Illness, unspecified: Secondary | ICD-10-CM | POA: Diagnosis not present

## 2018-12-11 DIAGNOSIS — R0981 Nasal congestion: Secondary | ICD-10-CM | POA: Diagnosis not present

## 2018-12-27 DIAGNOSIS — R05 Cough: Secondary | ICD-10-CM | POA: Diagnosis not present

## 2018-12-27 DIAGNOSIS — J189 Pneumonia, unspecified organism: Secondary | ICD-10-CM | POA: Diagnosis not present

## 2018-12-27 DIAGNOSIS — R0602 Shortness of breath: Secondary | ICD-10-CM | POA: Diagnosis not present

## 2019-01-04 DIAGNOSIS — R69 Illness, unspecified: Secondary | ICD-10-CM | POA: Diagnosis not present

## 2019-01-04 DIAGNOSIS — R05 Cough: Secondary | ICD-10-CM | POA: Diagnosis not present

## 2019-01-25 ENCOUNTER — Other Ambulatory Visit: Payer: Self-pay | Admitting: Obstetrics and Gynecology

## 2019-02-06 ENCOUNTER — Other Ambulatory Visit: Payer: Self-pay | Admitting: Obstetrics and Gynecology

## 2019-02-06 DIAGNOSIS — Z1231 Encounter for screening mammogram for malignant neoplasm of breast: Secondary | ICD-10-CM

## 2019-02-14 DIAGNOSIS — R69 Illness, unspecified: Secondary | ICD-10-CM | POA: Diagnosis not present

## 2019-03-08 ENCOUNTER — Other Ambulatory Visit: Payer: Self-pay | Admitting: Obstetrics and Gynecology

## 2019-03-08 MED ORDER — ESTRADIOL 10 MCG VA TABS: ORAL_TABLET | VAGINAL | 0 refills | Status: DC

## 2019-03-08 NOTE — Telephone Encounter (Signed)
Medication refill request: Yuvafem Last AEX:  02/12/18 BS Next AEX: 05/29/19 Last MMG (if hormonal medication request): 09/07/18 Right Diagnostic MM/US - BIRADS 1 negative/density b Refill authorized: Please advise; order pended for #18 w/4 refills if authorized

## 2019-03-08 NOTE — Telephone Encounter (Signed)
Patient is asking for a refill of [facility] [place].

## 2019-03-11 ENCOUNTER — Inpatient Hospital Stay: Admission: RE | Admit: 2019-03-11 | Payer: Medicare HMO | Source: Ambulatory Visit

## 2019-03-14 ENCOUNTER — Ambulatory Visit: Payer: Medicare HMO | Admitting: Obstetrics and Gynecology

## 2019-03-14 ENCOUNTER — Ambulatory Visit: Payer: PRIVATE HEALTH INSURANCE | Admitting: Obstetrics and Gynecology

## 2019-03-15 IMAGING — MG DIGITAL DIAGNOSTIC UNILATERAL RIGHT MAMMOGRAM WITH TOMO AND CAD
6 series · 6 of 18 positions shown · non-contrast
Comparison: Previous exam(s).

CLINICAL DATA: Focal pain right breast

EXAM:
DIGITAL DIAGNOSTIC RIGHT MAMMOGRAM WITH CAD AND TOMO
ULTRASOUND RIGHT BREAST

[R CC synth-2D]
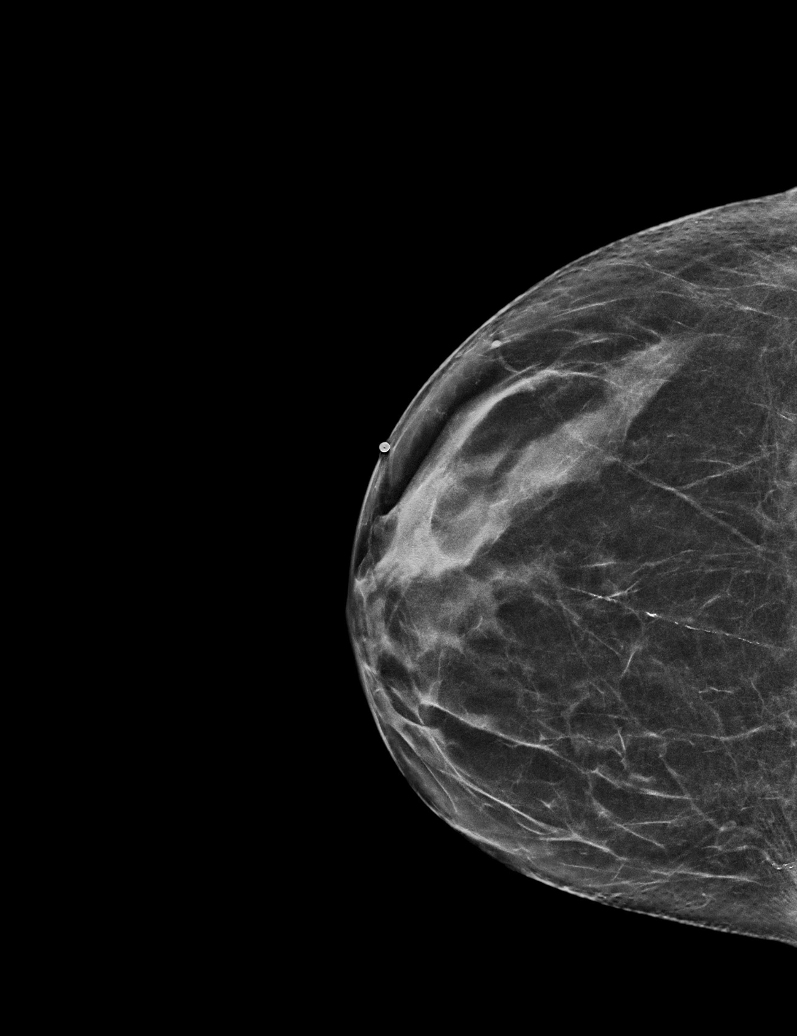

[R TAN synth-2D]
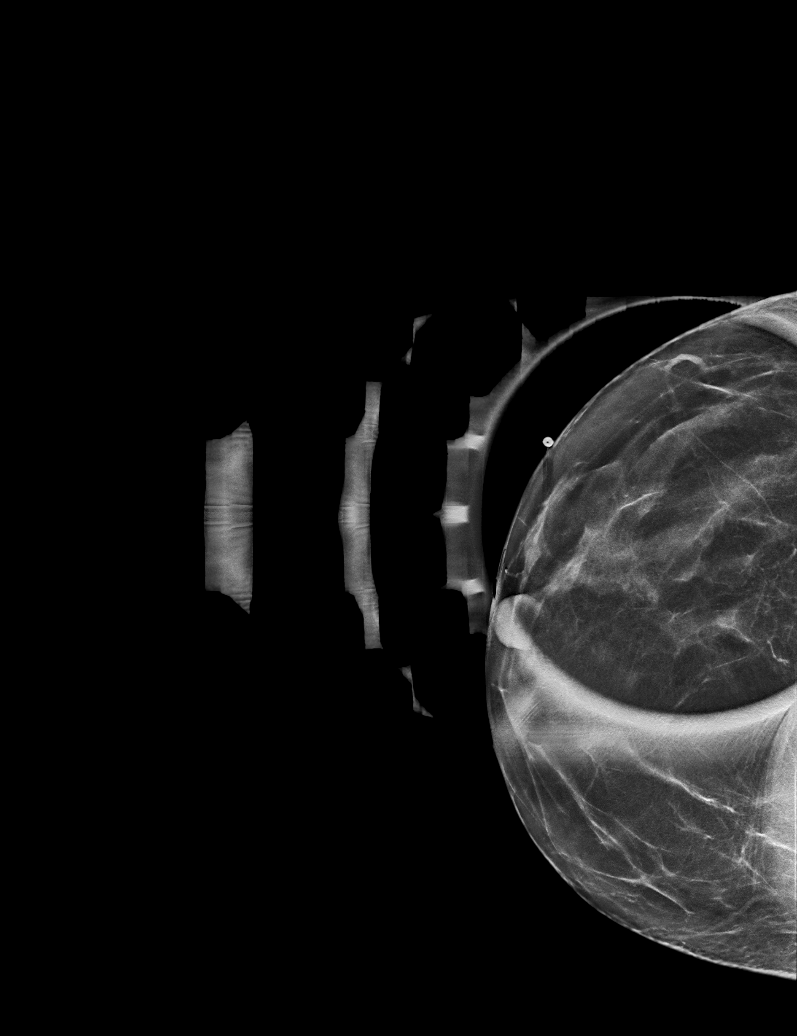

[R MLO synth-2D]
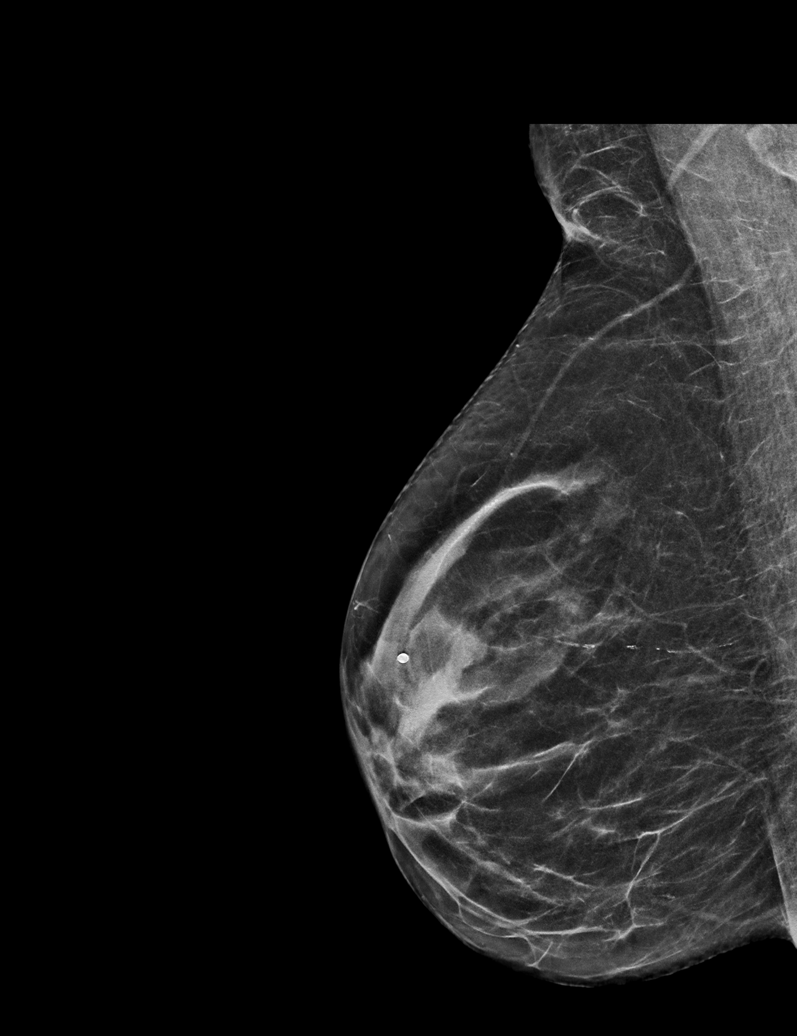

[R MLO tomo · tomo slice 29/57.0]
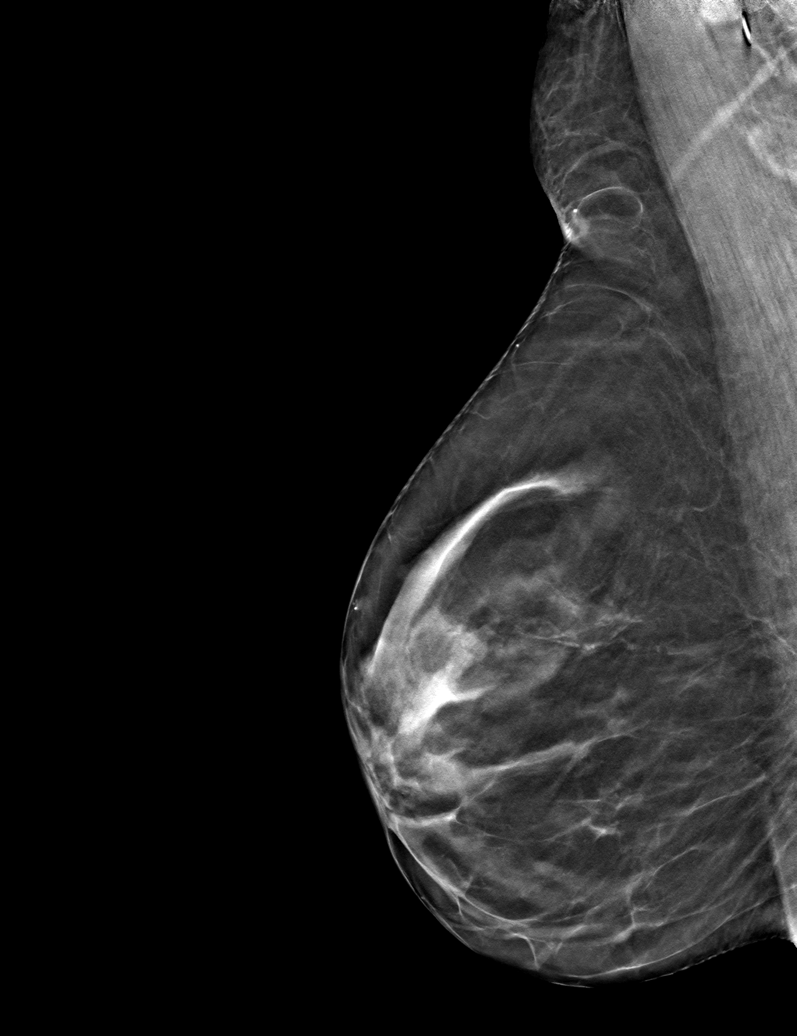

[R TAN tomo · tomo slice 25/48.0]
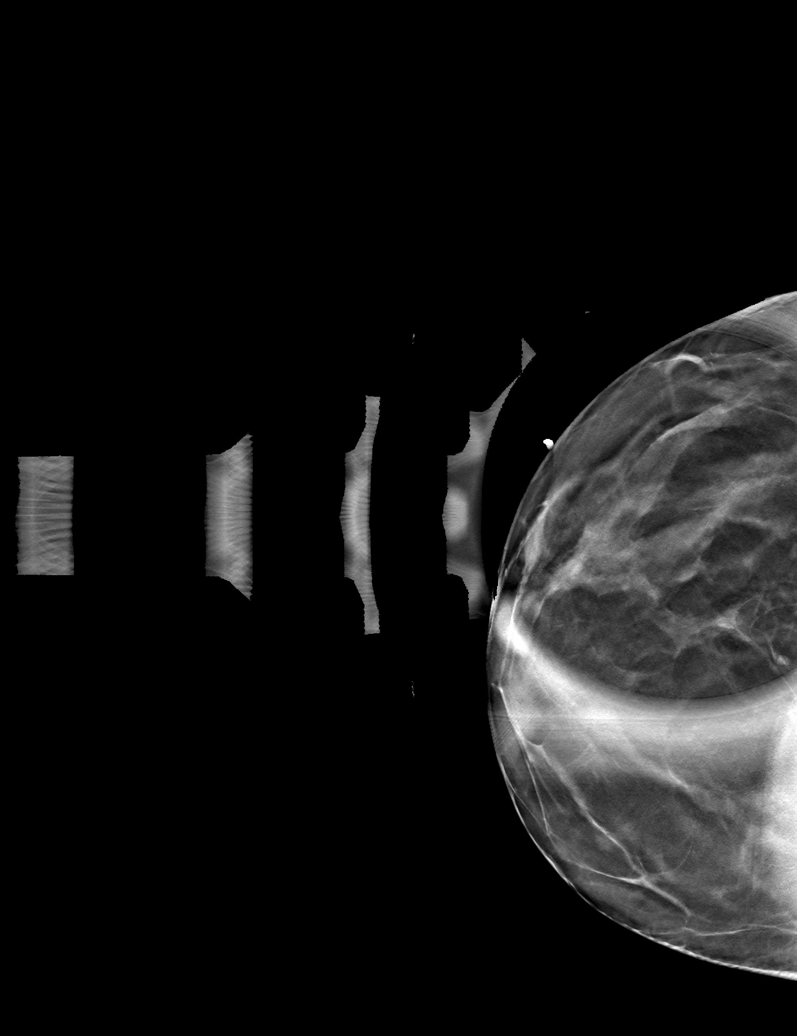

[R CC tomo · tomo slice 29/58.0]
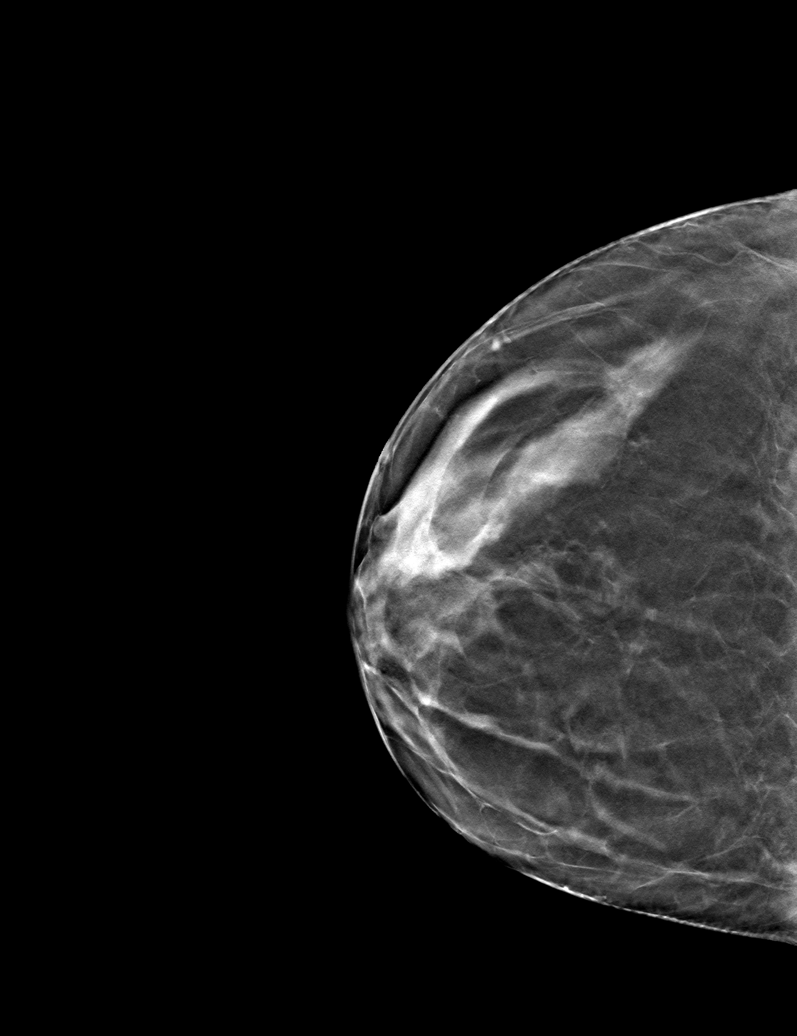

[6 of 18 positions shown; findings below may reference images not displayed]

ACR Breast Density Category b: There are scattered areas of
fibroglandular density.
FINDINGS: Cc and MLO views of the right breast, spot tangential view of right
breast are submitted. No suspicious abnormalities identified in the
right breast.

Mammographic images were processed with CAD.

Targeted ultrasound is performed, showing no focal abnormal discrete
cystic or solid lesion in the upper-outer quadrant right breast area
pain.
IMPRESSION: Negative.

RECOMMENDATION:
Routine screening mammogram back on schedule.

I have discussed the findings and recommendations with the patient.
Results were also provided in writing at the conclusion of the
visit. If applicable, a reminder letter will be sent to the patient
regarding the next appointment.

BI-RADS CATEGORY  1: Negative.

## 2019-04-30 ENCOUNTER — Other Ambulatory Visit: Payer: Self-pay

## 2019-05-01 ENCOUNTER — Encounter: Payer: Self-pay | Admitting: Obstetrics and Gynecology

## 2019-05-01 ENCOUNTER — Ambulatory Visit (INDEPENDENT_AMBULATORY_CARE_PROVIDER_SITE_OTHER): Payer: Medicare HMO | Admitting: Obstetrics and Gynecology

## 2019-05-01 VITALS — BP 102/68 | HR 68 | Temp 97.6°F | Resp 12 | Ht 62.75 in | Wt 123.8 lb

## 2019-05-01 DIAGNOSIS — Z01419 Encounter for gynecological examination (general) (routine) without abnormal findings: Secondary | ICD-10-CM

## 2019-05-01 DIAGNOSIS — Z23 Encounter for immunization: Secondary | ICD-10-CM

## 2019-05-01 MED ORDER — ESTRADIOL 10 MCG VA TABS: ORAL_TABLET | VAGINAL | 3 refills | Status: DC

## 2019-05-01 NOTE — Progress Notes (Signed)
68 y.o. G41P2002 Divorced Caucasian female here for annual exam.    States her anal area is uncomfortable.  No pain or itching.  Hx hemorrhoids.  Using preparation H and Witch hazel, and it is feeling better and improving.  Having regular BMs and not straining.  No blood in the stool and no black tarry stool.  Occasional pad use when she is using rectal medication.   No urinary incontinence.  Does Kegels.  Needs refill of Vagifem.   Doing a lot of outside work.  PCP:  Lajean Manes, MD   Patient's last menstrual period was 02/09/2002.           Sexually active: No.  The current method of family planning is status post hysterectomy and post menopausal status.    Exercising: Yes.    walking and jazzercise Smoker:  no  Health Maintenance: Pap:  11-19-14 Ascus:Neg HR HPV.  Final pathology for uterus/cervix 2015 - no dysplasia or atypia. History of abnormal Pap:  Yes,2008 hx of dysplasia with colposcopy but no treatment to cervix MMG:  09/07/18 Right Diagnostic MM/US - BIRADS 1 negative/density b.  Scheduled for 06/01/19. Colonoscopy:  September 2019 -- polyps removed f/u 5 years BMD:   2018  Result  Osteopenia per patient at [place]:  2010 Gardasil:   n/a HIV: never Hep C: never Screening Labs: PCP   reports that she has never smoked. She has never used smokeless tobacco. She reports current alcohol use of about 2.0 standard drinks of alcohol per week. She reports that she does not use drugs.  Past Medical History:  Diagnosis Date  . Family history of colon cancer   . Family history of pancreatic cancer   . Hypothyroidism   . Osteopenia   . Thyroid disease    Hyperthyroid--had iodine therapy  . Urinary incontinence    with sneezing, coughing    Past Surgical History:  Procedure Laterality Date  . BLADDER SUSPENSION N/A 07/01/2014   Procedure:  TRANSVAGINAL TAPE (TVT) PROCEDURE,;  Surgeon: Jamey Reas de Berton Lan, MD;  Location: [facility];  Service:  Gynecology;  Laterality: N/A;  . CATARACT EXTRACTION Bilateral   . CYSTOSCOPY N/A 07/01/2014   Procedure: CYSTOSCOPY;  Surgeon: Jamey Reas de Berton Lan, MD;  Location: [facility];  Service: Gynecology;  Laterality: N/A;  . THORACIC OUTLET SURGERY    . VAGINAL HYSTERECTOMY N/A 07/01/2014   Procedure: HYSTERECTOMY VAGINAL;  Surgeon: Jamey Reas de Berton Lan, MD;  Location: [facility];  Service: Gynecology;  Laterality: N/A;    Current Outpatient Medications  Medication Sig Dispense Refill  . Calcium Citrate-Vitamin D ([facility]S CALCIUM CITRATE + D PO) Take 600 mg by mouth 2 (two) times daily.    . Cetirizine HCl (ZYRTEC ALLERGY PO) Take by mouth as needed.    . Cholecalciferol (VITAMIN D-3) 1000 UNITS CAPS Take 1,000 Units by mouth daily.     . Estradiol 10 MCG TABS vaginal tablet Place one tablet (10 mcg) per vagina at hs twice weekly. 24 tablet 0  . Fish Oil-Cholecalciferol (FISH OIL + D3) 1000-1000 MG-UNIT CAPS Take 1,000 mg by mouth daily.    Marland Kitchen levothyroxine (SYNTHROID, LEVOTHROID) 25 MCG tablet Take 25 mcg by mouth daily.    . Multiple Vitamin (MULTIVITAMIN WITH MINERALS) TABS tablet Take 1 tablet by mouth daily.    . tacrolimus (PROTOPIC) 0.1 % ointment Apply topically 2 (two) times daily.     No current facility-administered medications for this  visit.     Family History  Problem Relation Age of Onset  . Pancreatic cancer Mother 58  . Cancer Maternal Aunt 95       large intestine  . Testicular cancer Maternal Uncle   . Other Maternal Grandfather 65       intestien issues- unk if cancer  . Cancer Maternal Uncle        leg- unk if bone or soft tissue?  . Cancer Cousin        knee- bone cancer?    Review of Systems  Constitutional: Negative.   HENT: Negative.   Eyes: Negative.   Respiratory: Negative.   Cardiovascular: Negative.   Gastrointestinal:       Anal discomfort  Endocrine: Negative.   Genitourinary: Negative.   Musculoskeletal: Negative.   Skin:  Negative.   Allergic/Immunologic: Negative.   Neurological: Negative.   Hematological: Negative.   Psychiatric/Behavioral: Negative.     Exam:   BP 102/68 (BP Location: Right Arm, Patient Position: Sitting, Cuff Size: Normal)   Pulse 68   Temp 97.6 F (36.4 C) (Temporal)   Resp 12   Ht 5' 2.75" (1.594 m)   Wt 123 lb 12.8 oz (56.2 kg)   LMP 02/09/2002   BMI 22.11 kg/m     General appearance: alert, cooperative and appears stated age Head: Normocephalic, without obvious abnormality, atraumatic Neck: no adenopathy, supple, symmetrical, trachea midline and thyroid normal to inspection and palpation Lungs: clear to auscultation bilaterally Breasts: normal appearance, no masses or tenderness, No nipple retraction or dimpling, No nipple discharge or bleeding, No axillary or supraclavicular adenopathy Heart: regular rate and rhythm Abdomen: soft, non-tender; no masses, no organomegaly Extremities: extremities normal, atraumatic, no cyanosis or edema Skin: Skin color, texture, turgor normal. No rashes or lesions Lymph nodes: Cervical, supraclavicular, and axillary nodes normal. No abnormal inguinal nodes palpated Neurologic: Grossly normal  Pelvic: External genitalia:  no lesions              Urethra:  normal appearing urethra with no masses, tenderness or lesions              Bartholins and Skenes: normal                 Vagina: normal appearing vagina with normal color and discharge, no lesions.  Good support              Cervix:  absent              Pap taken: No. Bimanual Exam:  Uterus:   Absent.              Adnexa: no mass, fullness, tenderness              Rectal exam: Yes.  .  Confirms.              Anus:  normal sphincter tone, no lesions.  No hemorrhoids noted.  Chaperone was present for exam.  Assessment:   Well woman visit with normal exam. Status post TVH/Anterior and Posterior Colporrhaphy, TVT/cysto.  Ovaries remain.  Osteopenia.  Vaginal  atrophy.  Plan: Mammogram screening. Recommended self breast awareness. Pap and HR HPV as above. Guidelines for Calcium, Vitamin D, regular exercise program including cardiovascular and weight bearing exercise. Refill of Vagifem for one year.  BMD due this year through her PCP.  Tdap today.  She will discuss Shingrix with her PCP.  Labs with PCP.   Follow up annually and prn.  After visit summary provided.

## 2019-05-01 NOTE — Patient Instructions (Signed)

## 2019-05-29 ENCOUNTER — Other Ambulatory Visit: Payer: Self-pay

## 2019-05-29 ENCOUNTER — Ambulatory Visit
Admission: RE | Admit: 2019-05-29 | Discharge: 2019-05-29 | Disposition: A | Discharge status: Home / Self Care | Payer: Medicare HMO | Source: Ambulatory Visit | Attending: Obstetrics and Gynecology | Admitting: Obstetrics and Gynecology

## 2019-05-29 DIAGNOSIS — Z1231 Encounter for screening mammogram for malignant neoplasm of breast: Secondary | ICD-10-CM

## 2019-07-09 DIAGNOSIS — Z23 Encounter for immunization: Secondary | ICD-10-CM | POA: Diagnosis not present

## 2019-07-09 DIAGNOSIS — M8588 Other specified disorders of bone density and structure, other site: Secondary | ICD-10-CM | POA: Diagnosis not present

## 2019-07-09 DIAGNOSIS — Z79899 Other long term (current) drug therapy: Secondary | ICD-10-CM | POA: Diagnosis not present

## 2019-07-09 DIAGNOSIS — E039 Hypothyroidism, unspecified: Secondary | ICD-10-CM | POA: Diagnosis not present

## 2019-07-09 DIAGNOSIS — Z0000 Encounter for general adult medical examination without abnormal findings: Secondary | ICD-10-CM | POA: Diagnosis not present

## 2019-07-09 DIAGNOSIS — Z1389 Encounter for screening for other disorder: Secondary | ICD-10-CM | POA: Diagnosis not present

## 2019-07-09 DIAGNOSIS — R42 Dizziness and giddiness: Secondary | ICD-10-CM | POA: Diagnosis not present

## 2019-07-15 DIAGNOSIS — H524 Presbyopia: Secondary | ICD-10-CM | POA: Diagnosis not present

## 2019-07-15 DIAGNOSIS — Z961 Presence of intraocular lens: Secondary | ICD-10-CM | POA: Diagnosis not present

## 2019-08-22 DIAGNOSIS — R69 Illness, unspecified: Secondary | ICD-10-CM | POA: Diagnosis not present

## 2019-09-25 DIAGNOSIS — R69 Illness, unspecified: Secondary | ICD-10-CM | POA: Diagnosis not present

## 2019-10-23 DIAGNOSIS — Z0100 Encounter for examination of eyes and vision without abnormal findings: Secondary | ICD-10-CM | POA: Diagnosis not present

## 2020-01-17 ENCOUNTER — Ambulatory Visit: Payer: Medicare HMO | Attending: Internal Medicine

## 2020-01-17 DIAGNOSIS — Z23 Encounter for immunization: Secondary | ICD-10-CM | POA: Insufficient documentation

## 2020-01-17 NOTE — Progress Notes (Signed)
   Covid-19 Vaccination Clinic  Name:  Crystal Carr    MRN: [id number] DOB: Dec 28, 1950  01/17/2020  [name] was observed post Covid-19 immunization for 15 minutes without incidence. She was provided with Vaccine Information Sheet and instruction to access the V-Safe system.   [name] was instructed to call 911 with any severe reactions post vaccine: Marland Kitchen Difficulty breathing  . Swelling of your face and throat  . A fast heartbeat  . A bad rash all over your body  . Dizziness and weakness    Immunizations Administered    Name Date Dose VIS Date Route   Pfizer COVID-19 Vaccine 01/17/2020  8:24 AM 0.3 mL 11/22/2019 Intramuscular   Manufacturer: [place]   Lot: [id number]   [place]: [id number]

## 2020-02-11 ENCOUNTER — Ambulatory Visit: Payer: Medicare HMO | Attending: Internal Medicine

## 2020-02-11 DIAGNOSIS — Z23 Encounter for immunization: Secondary | ICD-10-CM | POA: Insufficient documentation

## 2020-02-11 NOTE — Progress Notes (Signed)
   Covid-19 Vaccination Clinic  Name:  Drishti Sybesma    MRN: [id number] DOB: 07-Jan-1951  02/11/2020  Ms. Pennock was observed post Covid-19 immunization for 15 minutes without incident. She was provided with Vaccine Information Sheet and instruction to access the V-Safe system.   Ms. Macker was instructed to call 911 with any severe reactions post vaccine: Marland Kitchen Difficulty breathing  . Swelling of face and throat  . A fast heartbeat  . A bad rash all over body  . Dizziness and weakness   Immunizations Administered    Name Date Dose VIS Date Route   Pfizer COVID-19 Vaccine 02/11/2020  8:44 AM 0.3 mL 11/22/2019 Intramuscular   Manufacturer: [place]   Lot: [id number]   [place]: [id number]

## 2020-02-26 DIAGNOSIS — R69 Illness, unspecified: Secondary | ICD-10-CM | POA: Diagnosis not present

## 2020-03-16 ENCOUNTER — Telehealth: Payer: Self-pay | Admitting: Obstetrics and Gynecology

## 2020-03-16 NOTE — Telephone Encounter (Signed)
Left message on voicemail to call and reschedule cancelled appointment. °

## 2020-04-24 ENCOUNTER — Other Ambulatory Visit: Payer: Self-pay | Admitting: Obstetrics and Gynecology

## 2020-04-24 DIAGNOSIS — L258 Unspecified contact dermatitis due to other agents: Secondary | ICD-10-CM | POA: Diagnosis not present

## 2020-04-24 DIAGNOSIS — Z1231 Encounter for screening mammogram for malignant neoplasm of breast: Secondary | ICD-10-CM

## 2020-04-30 NOTE — Progress Notes (Signed)
69 y.o. G83P2002 Divorced Caucasian female here for annual exam.    Thinks she has a rectocele or a hemorrhoid. She notices some tissue when she wipes the rectum.  No constipation or fecal incontinence.  Good bladder control. Increased urination with caffeine use.  Sometimes she hurries to the bathroom.  She can have an unusual sensation with she first starts her urinary stream, but it does not occur all the time.  She is able to void OK.  No blood in her urine.   She wants to continue with Vagifem.  She would like a written Rx.  Received her Covid vaccine.   Patient treated for scabies recently by dermatologist.  PCP: Lajean Manes, MD     Patient's last menstrual period was 02/09/2002.           Sexually active: No.  The current method of family planning is status post hysterectomy.    Exercising: Yes.    walking and jazzercize Smoker:  no  Health Maintenance: Pap: 11-19-14 Ascus:Neg HR HPV. Final pathology for uterus/cervix 2015 - no dysplasia or atypia. History of abnormal Pap:  Yes, 2008 hx of dysplasia with colposcopy but no treatment to cervix MMG: 05-29-19 3D/Neg/density C/BiRads1--appt. 05/2020 Colonoscopy: 08/2018 polyps;next 2024 [id number]   Result: Osteopenia w/[facility] TDaP: 05-01-19 Gardasil:   no HIV: never Hep C:never Screening Labs:   PCP.    reports that she has never smoked. She has never used smokeless tobacco. She reports current alcohol use of about 2.0 standard drinks of alcohol per week. She reports that she does not use drugs.  Past Medical History:  Diagnosis Date  . Family history of colon cancer   . Family history of pancreatic cancer   . Hypothyroidism   . Osteopenia   . Thyroid disease    Hyperthyroid--had iodine therapy  . Urinary incontinence    with sneezing, coughing    Past Surgical History:  Procedure Laterality Date  . BLADDER SUSPENSION N/A 07/01/2014   Procedure:  TRANSVAGINAL TAPE (TVT) PROCEDURE,;  Surgeon: Jamey Reas de  Berton Lan, MD;  Location: [facility];  Service: Gynecology;  Laterality: N/A;  . CATARACT EXTRACTION Bilateral   . CYSTOSCOPY N/A 07/01/2014   Procedure: CYSTOSCOPY;  Surgeon: Jamey Reas de Berton Lan, MD;  Location: [facility];  Service: Gynecology;  Laterality: N/A;  . THORACIC OUTLET SURGERY    . VAGINAL HYSTERECTOMY N/A 07/01/2014   Procedure: HYSTERECTOMY VAGINAL;  Surgeon: Jamey Reas de Berton Lan, MD;  Location: [facility];  Service: Gynecology;  Laterality: N/A;    Current Outpatient Medications  Medication Sig Dispense Refill  . Calcium Citrate-Vitamin D ([facility]S CALCIUM CITRATE + D PO) Take 600 mg by mouth 2 (two) times daily.    . Cetirizine HCl (ZYRTEC ALLERGY PO) Take by mouth as needed.    . Cholecalciferol (VITAMIN D-3) 1000 UNITS CAPS Take 1,000 Units by mouth daily.     . Estradiol 10 MCG TABS vaginal tablet Place one tablet (10 mcg) per vagina at hs twice weekly. 24 tablet 3  . Fish Oil-Cholecalciferol (FISH OIL + D3) 1000-1000 MG-UNIT CAPS Take 1,000 mg by mouth daily.    Marland Kitchen levothyroxine (SYNTHROID, LEVOTHROID) 25 MCG tablet Take 25 mcg by mouth daily.    . Multiple Vitamin (MULTIVITAMIN WITH MINERALS) TABS tablet Take 1 tablet by mouth daily.    . tacrolimus (PROTOPIC) 0.1 % ointment Apply topically 2 (two) times daily.    Marland Kitchen zinc gluconate 50 MG tablet  Take 50 mg by mouth daily.     No current facility-administered medications for this visit.    Family History  Problem Relation Age of Onset  . Pancreatic cancer Mother 72  . Cancer Maternal Aunt 95       large intestine  . Testicular cancer Maternal Uncle   . Other Maternal Grandfather 65       intestien issues- unk if cancer  . Cancer Maternal Uncle        leg- unk if bone or soft tissue?  . Cancer Cousin        knee- bone cancer?    Review of Systems  All other systems reviewed and are negative.   Exam:   BP 114/66   Pulse 90   Temp 97.8 F (36.6 C) (Temporal)   Resp 16   Ht 5\' 3"  (1.6  m)   Wt 124 lb 9.6 oz (56.5 kg)   LMP 02/09/2002   BMI 22.07 kg/m     General appearance: alert, cooperative and appears stated age Head: normocephalic, without obvious abnormality, atraumatic Neck: no adenopathy, supple, symmetrical, trachea midline and thyroid normal to inspection and palpation Lungs: clear to auscultation bilaterally Breasts: normal appearance, no masses or tenderness, No nipple retraction or dimpling, No nipple discharge or bleeding, No axillary adenopathy Heart: regular rate and rhythm Abdomen: soft, non-tender; no masses, no organomegaly Extremities: extremities normal, atraumatic, no cyanosis or edema Skin: skin color, texture, turgor normal. No rashes or lesions Lymph nodes: cervical, supraclavicular, and axillary nodes normal. Neurologic: grossly normal  Pelvic: External genitalia:  no lesions              No abnormal inguinal nodes palpated.              Urethra:  normal appearing urethra with no masses, tenderness or lesions              Bartholins and Skenes: normal                 Vagina: normal appearing vagina with normal color and discharge, no lesions              Cervix: absent              Pap taken: No. Bimanual Exam:  Uterus:absent              Adnexa: no mass, fullness, tenderness              Rectal exam: No..  Confirms.              Anus:  normal sphincter tone, no lesions  Chaperone was present for exam.  Assessment:   Well woman visit with normal exam. Status post TVH/Anterior and Posterior Colporrhaphy, TVT/cysto.Ovaries remain. Osteopenia.  Vaginal atrophy.  Plan: Mammogram screening discussed. Self breast awareness reviewed. Pap and HR HPV as above. Guidelines for Calcium, Vitamin D, regular exercise program including cardiovascular and weight bearing exercise. Rx for Vagifem.  I discussed potential effect of estrogen on breast cancer.  BMD with PCP this year.  Follow up annually and prn.   After visit summary provided.

## 2020-05-01 ENCOUNTER — Ambulatory Visit: Payer: Medicare HMO | Admitting: Obstetrics and Gynecology

## 2020-05-01 ENCOUNTER — Other Ambulatory Visit: Payer: Self-pay

## 2020-05-04 ENCOUNTER — Ambulatory Visit (INDEPENDENT_AMBULATORY_CARE_PROVIDER_SITE_OTHER): Payer: Medicare HMO | Admitting: Obstetrics and Gynecology

## 2020-05-04 ENCOUNTER — Encounter: Payer: Self-pay | Admitting: Obstetrics and Gynecology

## 2020-05-04 ENCOUNTER — Other Ambulatory Visit: Payer: Self-pay

## 2020-05-04 VITALS — BP 114/66 | HR 90 | Temp 97.8°F | Resp 16 | Ht 63.0 in | Wt 124.6 lb

## 2020-05-04 DIAGNOSIS — Z01419 Encounter for gynecological examination (general) (routine) without abnormal findings: Secondary | ICD-10-CM

## 2020-05-04 MED ORDER — ESTRADIOL 10 MCG VA TABS: ORAL_TABLET | VAGINAL | 3 refills | Status: DC

## 2020-05-04 NOTE — Patient Instructions (Signed)

## 2020-05-13 ENCOUNTER — Telehealth: Payer: Self-pay | Admitting: *Deleted

## 2020-05-13 NOTE — Telephone Encounter (Signed)
Formulary exception notification received via fax from [place].   Request denied for Yuvafem 10 mcg vag tab . The requested drug is on formulary at preferred tier, Tier 3.    Call returned to patient. Advised as seen above. Reviewed option of checking with plan for alternatives on formulary or using savings coupon. Patient is going to check with plan to determine other options on formulary and return call to advise how she would like to proceed. Questions answered.

## 2020-05-29 ENCOUNTER — Other Ambulatory Visit: Payer: Self-pay

## 2020-05-29 ENCOUNTER — Ambulatory Visit
Admission: RE | Admit: 2020-05-29 | Discharge: 2020-05-29 | Disposition: A | Discharge status: Home / Self Care | Payer: Medicare HMO | Source: Ambulatory Visit | Attending: Obstetrics and Gynecology | Admitting: Obstetrics and Gynecology

## 2020-05-29 DIAGNOSIS — Z1231 Encounter for screening mammogram for malignant neoplasm of breast: Secondary | ICD-10-CM | POA: Diagnosis not present

## 2020-06-10 NOTE — Telephone Encounter (Signed)
Encounter closed.

## 2020-07-20 DIAGNOSIS — H524 Presbyopia: Secondary | ICD-10-CM | POA: Diagnosis not present

## 2020-07-20 DIAGNOSIS — Z961 Presence of intraocular lens: Secondary | ICD-10-CM | POA: Diagnosis not present

## 2020-07-25 DIAGNOSIS — Z0100 Encounter for examination of eyes and vision without abnormal findings: Secondary | ICD-10-CM | POA: Diagnosis not present

## 2020-07-31 ENCOUNTER — Other Ambulatory Visit: Payer: Self-pay | Admitting: Geriatric Medicine

## 2020-07-31 DIAGNOSIS — Z0000 Encounter for general adult medical examination without abnormal findings: Secondary | ICD-10-CM | POA: Diagnosis not present

## 2020-07-31 DIAGNOSIS — Z79899 Other long term (current) drug therapy: Secondary | ICD-10-CM | POA: Diagnosis not present

## 2020-07-31 DIAGNOSIS — E039 Hypothyroidism, unspecified: Secondary | ICD-10-CM | POA: Diagnosis not present

## 2020-07-31 DIAGNOSIS — Z1389 Encounter for screening for other disorder: Secondary | ICD-10-CM | POA: Diagnosis not present

## 2020-07-31 DIAGNOSIS — M79672 Pain in left foot: Secondary | ICD-10-CM | POA: Diagnosis not present

## 2020-07-31 DIAGNOSIS — L821 Other seborrheic keratosis: Secondary | ICD-10-CM | POA: Diagnosis not present

## 2020-07-31 DIAGNOSIS — M8588 Other specified disorders of bone density and structure, other site: Secondary | ICD-10-CM

## 2020-07-31 DIAGNOSIS — M810 Age-related osteoporosis without current pathological fracture: Secondary | ICD-10-CM | POA: Diagnosis not present

## 2020-07-31 DIAGNOSIS — M79645 Pain in left finger(s): Secondary | ICD-10-CM | POA: Diagnosis not present

## 2020-08-13 ENCOUNTER — Other Ambulatory Visit: Payer: Self-pay

## 2020-08-13 ENCOUNTER — Ambulatory Visit (INDEPENDENT_AMBULATORY_CARE_PROVIDER_SITE_OTHER): Payer: Medicare HMO

## 2020-08-13 ENCOUNTER — Ambulatory Visit: Payer: Medicare HMO | Admitting: Podiatry

## 2020-08-13 DIAGNOSIS — M779 Enthesopathy, unspecified: Secondary | ICD-10-CM

## 2020-08-13 DIAGNOSIS — S9032XA Contusion of left foot, initial encounter: Secondary | ICD-10-CM | POA: Diagnosis not present

## 2020-08-13 DIAGNOSIS — M21619 Bunion of unspecified foot: Secondary | ICD-10-CM

## 2020-08-13 DIAGNOSIS — M21969 Unspecified acquired deformity of unspecified lower leg: Secondary | ICD-10-CM | POA: Diagnosis not present

## 2020-08-13 NOTE — Patient Instructions (Signed)
Bunion  A bunion is a bump on the base of the big toe that forms when the bones of the big toe joint move out of position. Bunions may be small at first, but they often get larger over time. They can make walking painful. What are the causes? A bunion may be caused by:  Wearing narrow or pointed shoes that force the big toe to press against the other toes.  Abnormal foot development that causes the foot to roll inward (pronate).  Changes in the foot that are caused by certain diseases, such as rheumatoid arthritis or polio.  A foot injury. What increases the risk? The following factors may make you more likely to develop this condition:  Wearing shoes that squeeze the toes together.  Having certain diseases, such as: ? Rheumatoid arthritis. ? Polio. ? Cerebral palsy.  Having family members who have bunions.  Being born with a foot deformity, such as flat feet or low arches.  Doing activities that put a lot of pressure on the feet, such as ballet dancing. What are the signs or symptoms? The main symptom of a bunion is a noticeable bump on the big toe. Other symptoms may include:  Pain.  Swelling around the big toe.  Redness and inflammation.  Thick or hardened skin on the big toe or between the toes.  Stiffness or loss of motion in the big toe.  Trouble with walking. How is this diagnosed? A bunion may be diagnosed based on your symptoms, medical history, and activities. You may have tests, such as:  X-rays. These allow your health care provider to check the position of the bones in your foot and look for damage to your joint. They also help your health care provider determine the severity of your bunion and the best way to treat it.  Joint aspiration. In this test, a sample of fluid is removed from the toe joint. This test may be done if you are in a lot of pain. It helps rule out diseases that cause painful swelling of the joints, such as arthritis. How is this  treated? Treatment depends on the severity of your symptoms. The goal of treatment is to relieve symptoms and prevent the bunion from getting worse. Your health care provider may recommend:  Wearing shoes that have a wide toe box.  Using bunion pads to cushion the affected area.  Taping your toes together to keep them in a normal position.  Placing a device inside your shoe (orthotics) to help reduce pressure on your toe joint.  Taking medicine to ease pain, inflammation, and swelling.  Applying heat or ice to the affected area.  Doing stretching exercises.  Surgery to remove scar tissue and move the toes back into their normal position. This treatment is rare. Follow these instructions at home: Managing pain, stiffness, and swelling   If directed, put ice on the painful area: ? Put ice in a plastic bag. ? Place a towel between your skin and the bag. ? Leave the ice on for 20 minutes, 2-3 times a day. Activity   If directed, apply heat to the affected area before you exercise. Use the heat source that your health care provider recommends, such as a moist heat pack or a heating pad. ? Place a towel between your skin and the heat source. ? Leave the heat on for 20-30 minutes. ? Remove the heat if your skin turns bright red. This is especially important if you are unable to feel pain,   heat, or cold. You may have a greater risk of getting burned.  Do exercises as told by your health care provider. General instructions  Support your toe joint with proper footwear, shoe padding, or taping as told by your health care provider.  Take over-the-counter and prescription medicines only as told by your health care provider.  Keep all follow-up visits as told by your health care provider. This is important. Contact a health care provider if your symptoms:  Get worse.  Do not improve in 2 weeks. Get help right away if you have:  Severe pain and trouble with walking. Summary  A  bunion is a bump on the base of the big toe that forms when the bones of the big toe joint move out of position.  Bunions can make walking painful.  Treatment depends on the severity of your symptoms.  Support your toe joint with proper footwear, shoe padding, or taping as told by your health care provider. This information is not intended to replace advice given to you by your health care provider. Make sure you discuss any questions you have with your health care provider. Document Revised: 06/04/2018 Document Reviewed: 04/10/2018 Elsevier Patient Education  2020 Elsevier Inc.  

## 2020-08-19 NOTE — Progress Notes (Signed)
Subjective:   Patient ID: Crystal Carr, female   DOB: 69 y.o.   MRN: [id number]   HPI 69 year old female presents the office today for concerns of bunion pain on her left foot.  She states that she has had this for quite some time but has been getting more tender recently.  She also has a bunion right foot but not causing discomfort.  The left side she states that she fell and since then the bunion did get worse.  This injury occurred a few years ago.  No other concerns today.  Review of Systems  All other systems reviewed and are negative.  Past Medical History:  Diagnosis Date  . Family history of colon cancer   . Family history of pancreatic cancer   . Hypothyroidism   . Osteopenia   . Thyroid disease    Hyperthyroid--had iodine therapy  . Urinary incontinence    with sneezing, coughing    Past Surgical History:  Procedure Laterality Date  . BLADDER SUSPENSION N/A 07/01/2014   Procedure:  TRANSVAGINAL TAPE (TVT) PROCEDURE,;  Surgeon: Jamey Reas de Berton Lan, MD;  Location: [facility];  Service: Gynecology;  Laterality: N/A;  . CATARACT EXTRACTION Bilateral   . CYSTOSCOPY N/A 07/01/2014   Procedure: CYSTOSCOPY;  Surgeon: Jamey Reas de Berton Lan, MD;  Location: [facility];  Service: Gynecology;  Laterality: N/A;  . THORACIC OUTLET SURGERY    . VAGINAL HYSTERECTOMY N/A 07/01/2014   Procedure: HYSTERECTOMY VAGINAL;  Surgeon: Jamey Reas de Berton Lan, MD;  Location: [facility];  Service: Gynecology;  Laterality: N/A;     Current Outpatient Medications:  .  augmented betamethasone dipropionate (DIPROLENE-AF) 0.05 % cream, Apply topically., Disp: , Rfl:  .  Calcium Citrate-Vitamin D ([employer]S CALCIUM CITRATE + D PO), Take 600 mg by mouth 2 (two) times daily., Disp: , Rfl:  .  Cetirizine HCl (ZYRTEC ALLERGY PO), Take by mouth as needed., Disp: , Rfl:  .  Cholecalciferol (VITAMIN D-3) 1000 UNITS CAPS, Take 1,000 Units by mouth daily. , Disp: , Rfl:  .   Estradiol 10 MCG TABS vaginal tablet, Place one tablet (10 mcg) per vagina at hs twice weekly., Disp: 24 tablet, Rfl: 3 .  Fish Oil-Cholecalciferol (FISH OIL + D3) 1000-1000 MG-UNIT CAPS, Take 1,000 mg by mouth daily., Disp: , Rfl:  .  levothyroxine (SYNTHROID, LEVOTHROID) 25 MCG tablet, Take 25 mcg by mouth daily., Disp: , Rfl:  .  Multiple Vitamin (MULTIVITAMIN WITH MINERALS) TABS tablet, Take 1 tablet by mouth daily., Disp: , Rfl:  .  tacrolimus (PROTOPIC) 0.1 % ointment, Apply topically 2 (two) times daily., Disp: , Rfl:  .  zinc gluconate 50 MG tablet, Take 50 mg by mouth daily., Disp: , Rfl:   Allergies  Allergen Reactions  . Almond Oil     Mouth itch from actual almond nut  . Cefzil [Cefprozil] Hives and Other (See Comments)    "blisters" on hands  . Fig Extract [Ficus]     mouth itch  . Peanuts [Peanut Oil]     Positive allergy test          Objective:  Physical Exam  General: AAO x3, NAD  Dermatological: Skin is warm, dry and supple bilateral. Nails x 10 are well manicured; remaining integument appears unremarkable at this time. There are no open sores, no preulcerative lesions, no rash or signs of infection present.  Vascular: Dorsalis Pedis artery and Posterior Tibial artery pedal pulses are 2/4  bilateral with immedate capillary fill time.There is no pain with calf compression, swelling, warmth, erythema.   Neruologic: Grossly intact via light touch bilateral.   Musculoskeletal: Significant bunion deformities present in the left foot and there is mild erythema along the medial first metatarsal and irritation side shoes with there is no skin breakdown, warmth.  Decreasing to motion of tarsal BJ.  Hypermobility present of the first ray.  Pronation deformity absent.  Muscular strength 5/5 in all groups tested bilateral.  Gait: Unassisted, Nonantalgic.       Assessment:   Severe bunion deformity left foot    Plan:  -Treatment options discussed including all  alternatives, risks, and complications -Etiology of symptoms were discussed -X-rays were obtained and reviewed with the patient. Severe bunion deformities present.  No active acute fracture. -Discussed the conservative as well as surgical treatment options.  This time she was proceed with conservative treatment.  She would like to proceed with orthotics and she will follow up with [facility] for measurement of orthotics.  Office follow-up with the pronation and can take some pressure off the bunion.  Also discussed possible Morton's extension for the bunion due to decreased range of motion.  Discussed discussed shoe modifications and offloading.  Trula Slade DPM

## 2020-08-26 DIAGNOSIS — Z809 Family history of malignant neoplasm, unspecified: Secondary | ICD-10-CM | POA: Diagnosis not present

## 2020-08-26 DIAGNOSIS — Z881 Allergy status to other antibiotic agents status: Secondary | ICD-10-CM | POA: Diagnosis not present

## 2020-08-26 DIAGNOSIS — E039 Hypothyroidism, unspecified: Secondary | ICD-10-CM | POA: Diagnosis not present

## 2020-08-26 DIAGNOSIS — M1990 Unspecified osteoarthritis, unspecified site: Secondary | ICD-10-CM | POA: Diagnosis not present

## 2020-08-26 DIAGNOSIS — Z7982 Long term (current) use of aspirin: Secondary | ICD-10-CM | POA: Diagnosis not present

## 2020-09-01 ENCOUNTER — Ambulatory Visit (INDEPENDENT_AMBULATORY_CARE_PROVIDER_SITE_OTHER): Payer: Medicare HMO | Admitting: Orthotics

## 2020-09-01 ENCOUNTER — Other Ambulatory Visit: Payer: Self-pay

## 2020-09-01 DIAGNOSIS — M21619 Bunion of unspecified foot: Secondary | ICD-10-CM | POA: Diagnosis not present

## 2020-09-01 DIAGNOSIS — M21969 Unspecified acquired deformity of unspecified lower leg: Secondary | ICD-10-CM

## 2020-09-01 NOTE — Progress Notes (Signed)
Cast today for f/o to address higher arch and offload 2nd mets b/l; she has bunion l > R, but doesn't seem to be bothered by that.  I also suggested she go to [employer] and get fitted for shoes that will help relieve bunion pain.

## 2020-09-02 DIAGNOSIS — R69 Illness, unspecified: Secondary | ICD-10-CM | POA: Diagnosis not present

## 2020-09-29 ENCOUNTER — Other Ambulatory Visit: Payer: Self-pay

## 2020-09-29 ENCOUNTER — Ambulatory Visit: Payer: Medicare HMO | Admitting: Orthotics

## 2020-09-29 DIAGNOSIS — M21969 Unspecified acquired deformity of unspecified lower leg: Secondary | ICD-10-CM

## 2020-09-29 DIAGNOSIS — M779 Enthesopathy, unspecified: Secondary | ICD-10-CM

## 2020-09-29 DIAGNOSIS — M21619 Bunion of unspecified foot: Secondary | ICD-10-CM

## 2020-09-29 NOTE — Progress Notes (Signed)
Patient came in today to p/up functional foot orthotics.   The orthotics were assessed to both fit and function.  The F/O addressed the biomechanical issues/pathologies as intended, offering good longitudinal arch support, proper offloading, and foot support. There weren't any signs of discomfort or irritation.  The F/O fit properly in footwear with minimal trimming/adjustments. 

## 2020-10-09 ENCOUNTER — Ambulatory Visit
Admission: RE | Admit: 2020-10-09 | Discharge: 2020-10-09 | Disposition: A | Discharge status: Home / Self Care | Payer: Medicare HMO | Source: Ambulatory Visit | Attending: Geriatric Medicine | Admitting: Geriatric Medicine

## 2020-10-09 ENCOUNTER — Other Ambulatory Visit: Payer: Self-pay

## 2020-10-09 DIAGNOSIS — Z780 Asymptomatic menopausal state: Secondary | ICD-10-CM | POA: Diagnosis not present

## 2020-10-09 DIAGNOSIS — M810 Age-related osteoporosis without current pathological fracture: Secondary | ICD-10-CM | POA: Diagnosis not present

## 2020-10-09 DIAGNOSIS — M8588 Other specified disorders of bone density and structure, other site: Secondary | ICD-10-CM

## 2020-10-19 DIAGNOSIS — Z23 Encounter for immunization: Secondary | ICD-10-CM | POA: Diagnosis not present

## 2020-10-19 DIAGNOSIS — M810 Age-related osteoporosis without current pathological fracture: Secondary | ICD-10-CM | POA: Diagnosis not present

## 2020-11-10 ENCOUNTER — Other Ambulatory Visit: Payer: Medicare HMO

## 2020-11-23 ENCOUNTER — Ambulatory Visit: Payer: Medicare HMO | Admitting: Orthotics

## 2020-11-23 ENCOUNTER — Other Ambulatory Visit: Payer: Self-pay

## 2020-11-23 DIAGNOSIS — M21969 Unspecified acquired deformity of unspecified lower leg: Secondary | ICD-10-CM

## 2020-11-23 DIAGNOSIS — M779 Enthesopathy, unspecified: Secondary | ICD-10-CM

## 2020-11-23 DIAGNOSIS — M21619 Bunion of unspecified foot: Secondary | ICD-10-CM

## 2020-11-23 NOTE — Progress Notes (Signed)
Took a little bit of material off lateral/distal end of sulcus f/o; she was pleased.

## 2021-04-12 ENCOUNTER — Other Ambulatory Visit: Payer: Self-pay | Admitting: Geriatric Medicine

## 2021-04-12 ENCOUNTER — Ambulatory Visit
Admission: RE | Admit: 2021-04-12 | Discharge: 2021-04-12 | Disposition: A | Discharge status: Home / Self Care | Payer: Medicare HMO | Source: Ambulatory Visit | Attending: Geriatric Medicine | Admitting: Geriatric Medicine

## 2021-04-12 DIAGNOSIS — M47816 Spondylosis without myelopathy or radiculopathy, lumbar region: Secondary | ICD-10-CM | POA: Diagnosis not present

## 2021-04-12 DIAGNOSIS — M533 Sacrococcygeal disorders, not elsewhere classified: Secondary | ICD-10-CM | POA: Diagnosis not present

## 2021-04-27 ENCOUNTER — Other Ambulatory Visit: Payer: Self-pay | Admitting: Obstetrics and Gynecology

## 2021-04-27 DIAGNOSIS — Z1231 Encounter for screening mammogram for malignant neoplasm of breast: Secondary | ICD-10-CM

## 2021-05-03 NOTE — Progress Notes (Signed)
70 y.o. G24P2002 Divorced Caucasian female here for annual exam.    Thinks her rectum is protruding.  No problems controlling stool. No known hemorrhoids or rectal bleeding.   No bladder leakage.  She has some tingling when she is finishing her voiding for a while.  This is not really pain for her. Feels like she is urinating well.  No dysuria.   No using vaginal estrogens.  Asking about estrogen treatment for osteoporosis.   PCP: Lajean Manes, MD    Patient's last menstrual period was 02/09/2002.           Sexually active: No.  The current method of family planning is status post hysterectomy.    Exercising: Yes.    hiking, walking and home videos Smoker:  no  Health Maintenance: Pap: 11-19-14 ASCUS:Neg HR HPV. Final pathology for uterus/cervix 2015 - no dysplasia or atypia. History of abnormal Pap:  Yes, 2008 hx of dysplasia with colposcopy but no treatment to cervix MMG: 06-02-20 3D/Neg/Birads1.  Has an appointment for July, 2022.  Colonoscopy:  08/2018 polyps;next 2024 BMD: 10-09-20  Result :Osteoporosis--PCP placed on Boniva recently TDaP:  05-01-19 Gardasil:   no HIV: never Hep C: never Screening Labs:  PCP.    reports that she has never smoked. She has never used smokeless tobacco. She reports current alcohol use of about 2.0 standard drinks of alcohol per week. She reports that she does not use drugs.  Past Medical History:  Diagnosis Date  . Family history of colon cancer   . Family history of pancreatic cancer   . Hypothyroidism   . Osteopenia   . Thyroid disease    Hyperthyroid--had iodine therapy  . Urinary incontinence    with sneezing, coughing    Past Surgical History:  Procedure Laterality Date  . BLADDER SUSPENSION N/A 07/01/2014   Procedure:  TRANSVAGINAL TAPE (TVT) PROCEDURE,;  Surgeon: Jamey Reas de Berton Lan, MD;  Location: [facility];  Service: Gynecology;  Laterality: N/A;  . CATARACT EXTRACTION Bilateral   . CYSTOSCOPY N/A 07/01/2014    Procedure: CYSTOSCOPY;  Surgeon: Jamey Reas de Berton Lan, MD;  Location: [facility];  Service: Gynecology;  Laterality: N/A;  . THORACIC OUTLET SURGERY    . VAGINAL HYSTERECTOMY N/A 07/01/2014   Procedure: HYSTERECTOMY VAGINAL;  Surgeon: Jamey Reas de Berton Lan, MD;  Location: [facility];  Service: Gynecology;  Laterality: N/A;    Current Outpatient Medications  Medication Sig Dispense Refill  . augmented betamethasone dipropionate (DIPROLENE-AF) 0.05 % cream Apply topically.    . Calcium Citrate-Vitamin D (CVS CALCIUM CITRATE + D PO) Take 600 mg by mouth 2 (two) times daily.    . Cetirizine HCl (ZYRTEC ALLERGY PO) Take by mouth as needed.    . Cholecalciferol (VITAMIN D-3) 1000 UNITS CAPS Take 1,000 Units by mouth daily.     . diclofenac Sodium (VOLTAREN) 1 % GEL apply left thumb    . Fish Oil-Cholecalciferol (FISH OIL + D3) 1000-1000 MG-UNIT CAPS Take 1,000 mg by mouth daily.    Marland Kitchen ibandronate (BONIVA) 150 MG tablet Take 150 mg by mouth every 30 (thirty) days.    Marland Kitchen levothyroxine (SYNTHROID, LEVOTHROID) 25 MCG tablet Take 25 mcg by mouth daily.    . Multiple Vitamin (MULTIVITAMIN WITH MINERALS) TABS tablet Take 1 tablet by mouth daily.    . tacrolimus (PROTOPIC) 0.1 % ointment Apply topically 2 (two) times daily.    Marland Kitchen zinc gluconate 50 MG tablet Take 50 mg  by mouth daily.     No current facility-administered medications for this visit.    Family History  Problem Relation Age of Onset  . Pancreatic cancer Mother 21  . Cancer Maternal Aunt 95       large intestine  . Testicular cancer Maternal Uncle   . Other Maternal Grandfather 65       intestien issues- unk if cancer  . Cancer Maternal Uncle        leg- unk if bone or soft tissue?  . Cancer Cousin        knee- bone cancer?    Review of Systems  All other systems reviewed and are negative.   Exam:   BP 118/62   Pulse 71   Ht 5' 3.5" (1.613 m)   Wt 118 lb (53.5 kg)   LMP 02/09/2002   SpO2 98%   BMI  20.57 kg/m     General appearance: alert, cooperative and appears stated age Head: normocephalic, without obvious abnormality, atraumatic Neck: no adenopathy, supple, symmetrical, trachea midline and thyroid normal to inspection and palpation Lungs: clear to auscultation bilaterally Breasts: normal appearance, no masses or tenderness, No nipple retraction or dimpling, No nipple discharge or bleeding, No axillary adenopathy Heart: regular rate and rhythm Abdomen: soft, non-tender; no masses, no organomegaly Extremities: extremities normal, atraumatic, no cyanosis or edema Skin: skin color, texture, turgor normal. No rashes or lesions Lymph nodes: cervical, supraclavicular, and axillary nodes normal. Neurologic: grossly normal  Pelvic: External genitalia:  no lesions              No abnormal inguinal nodes palpated.              Urethra:  Mild urethral prolapse, tenderness or lesions              Bartholins and Skenes: normal                 Vagina: normal appearing vagina with normal color and discharge, no lesions              Cervix:  absent              Pap taken: No. Bimanual Exam:  Uterus:  absent              Adnexa: no mass, fullness, tenderness              Rectal exam: Yes.  .  Confirms.              Anus:  normal sphincter tone, no lesions.  Tiny hemorrhoid at 11:00.  Chaperone was present for exam.  Assessment:    Status post TVH/Anterior and Posterior Colporrhaphy,TVT/cysto.Ovaries remain. Excellent support. Screening breast exam. Pelvic exam without abnormal findings.  Osteoporosis.   On Boniva.   Vaginal atrophy. Encounter for rectal exam.  Tiny hemorrhoid.  No rectal prolapse.  Plan: Mammogram screening discussed. Self breast awareness reviewed. Pap and HR HPV as above. Guidelines for Calcium, Vitamin D, regular exercise program including cardiovascular and weight bearing exercise. Rx for Estrace vaginal cream.  Instructed in use.  Potential effect on  breast cancer discussed.   Boniva indicated for treating osteoporosis.  Clarification that estrogens do not treat osteoporosis.  BMD in 2023 through PCP.  Follow up annually and prn.   30 min  total time was spent for this patient encounter, including preparation, face-to-face counseling with the patient, coordination of care, and documentation of the encounter.

## 2021-05-05 ENCOUNTER — Encounter: Payer: Self-pay | Admitting: Obstetrics and Gynecology

## 2021-05-05 ENCOUNTER — Ambulatory Visit: Payer: Medicare HMO | Admitting: Obstetrics and Gynecology

## 2021-05-05 ENCOUNTER — Other Ambulatory Visit: Payer: Self-pay

## 2021-05-05 ENCOUNTER — Ambulatory Visit (INDEPENDENT_AMBULATORY_CARE_PROVIDER_SITE_OTHER): Payer: Medicare HMO | Admitting: Obstetrics and Gynecology

## 2021-05-05 VITALS — BP 118/62 | HR 71 | Ht 63.5 in | Wt 118.0 lb

## 2021-05-05 DIAGNOSIS — M810 Age-related osteoporosis without current pathological fracture: Secondary | ICD-10-CM | POA: Diagnosis not present

## 2021-05-05 DIAGNOSIS — Z1239 Encounter for other screening for malignant neoplasm of breast: Secondary | ICD-10-CM

## 2021-05-05 DIAGNOSIS — Z01419 Encounter for gynecological examination (general) (routine) without abnormal findings: Secondary | ICD-10-CM

## 2021-05-05 DIAGNOSIS — Z008 Encounter for other general examination: Secondary | ICD-10-CM

## 2021-05-05 DIAGNOSIS — N952 Postmenopausal atrophic vaginitis: Secondary | ICD-10-CM | POA: Diagnosis not present

## 2021-05-05 MED ORDER — ESTRADIOL 0.1 MG/GM VA CREA: TOPICAL_CREAM | VAGINAL | 2 refills | Status: DC

## 2021-05-05 NOTE — Patient Instructions (Signed)

## 2021-06-23 ENCOUNTER — Ambulatory Visit
Admission: RE | Admit: 2021-06-23 | Discharge: 2021-06-23 | Disposition: A | Discharge status: Home / Self Care | Payer: Medicare HMO | Source: Ambulatory Visit | Attending: Obstetrics and Gynecology | Admitting: Obstetrics and Gynecology

## 2021-06-23 ENCOUNTER — Other Ambulatory Visit: Payer: Self-pay

## 2021-06-23 DIAGNOSIS — Z1231 Encounter for screening mammogram for malignant neoplasm of breast: Secondary | ICD-10-CM | POA: Diagnosis not present

## 2021-07-26 DIAGNOSIS — H524 Presbyopia: Secondary | ICD-10-CM | POA: Diagnosis not present

## 2021-07-26 DIAGNOSIS — Z961 Presence of intraocular lens: Secondary | ICD-10-CM | POA: Diagnosis not present

## 2021-08-04 DIAGNOSIS — E039 Hypothyroidism, unspecified: Secondary | ICD-10-CM | POA: Diagnosis not present

## 2021-08-04 DIAGNOSIS — Z1331 Encounter for screening for depression: Secondary | ICD-10-CM | POA: Diagnosis not present

## 2021-08-04 DIAGNOSIS — M810 Age-related osteoporosis without current pathological fracture: Secondary | ICD-10-CM | POA: Diagnosis not present

## 2021-08-04 DIAGNOSIS — E7800 Pure hypercholesterolemia, unspecified: Secondary | ICD-10-CM | POA: Diagnosis not present

## 2021-08-04 DIAGNOSIS — Z0000 Encounter for general adult medical examination without abnormal findings: Secondary | ICD-10-CM | POA: Diagnosis not present

## 2021-08-04 DIAGNOSIS — M542 Cervicalgia: Secondary | ICD-10-CM | POA: Diagnosis not present

## 2021-08-04 DIAGNOSIS — Z79899 Other long term (current) drug therapy: Secondary | ICD-10-CM | POA: Diagnosis not present

## 2021-08-13 DIAGNOSIS — N951 Menopausal and female climacteric states: Secondary | ICD-10-CM | POA: Diagnosis not present

## 2021-08-13 DIAGNOSIS — Z008 Encounter for other general examination: Secondary | ICD-10-CM | POA: Diagnosis not present

## 2021-08-13 DIAGNOSIS — E039 Hypothyroidism, unspecified: Secondary | ICD-10-CM | POA: Diagnosis not present

## 2021-08-13 DIAGNOSIS — M810 Age-related osteoporosis without current pathological fracture: Secondary | ICD-10-CM | POA: Diagnosis not present

## 2021-08-13 DIAGNOSIS — Z79890 Hormone replacement therapy (postmenopausal): Secondary | ICD-10-CM | POA: Diagnosis not present

## 2021-08-13 DIAGNOSIS — Z809 Family history of malignant neoplasm, unspecified: Secondary | ICD-10-CM | POA: Diagnosis not present

## 2021-08-23 DIAGNOSIS — M542 Cervicalgia: Secondary | ICD-10-CM | POA: Diagnosis not present

## 2021-11-10 DIAGNOSIS — L2089 Other atopic dermatitis: Secondary | ICD-10-CM | POA: Diagnosis not present

## 2021-12-29 DIAGNOSIS — M545 Low back pain, unspecified: Secondary | ICD-10-CM | POA: Diagnosis not present

## 2021-12-29 DIAGNOSIS — M542 Cervicalgia: Secondary | ICD-10-CM | POA: Diagnosis not present

## 2022-01-04 DIAGNOSIS — R531 Weakness: Secondary | ICD-10-CM | POA: Diagnosis not present

## 2022-01-04 DIAGNOSIS — M545 Low back pain, unspecified: Secondary | ICD-10-CM | POA: Diagnosis not present

## 2022-01-07 DIAGNOSIS — R531 Weakness: Secondary | ICD-10-CM | POA: Diagnosis not present

## 2022-01-07 DIAGNOSIS — M545 Low back pain, unspecified: Secondary | ICD-10-CM | POA: Diagnosis not present

## 2022-01-11 DIAGNOSIS — R531 Weakness: Secondary | ICD-10-CM | POA: Diagnosis not present

## 2022-01-11 DIAGNOSIS — M545 Low back pain, unspecified: Secondary | ICD-10-CM | POA: Diagnosis not present

## 2022-01-14 DIAGNOSIS — M545 Low back pain, unspecified: Secondary | ICD-10-CM | POA: Diagnosis not present

## 2022-01-14 DIAGNOSIS — R531 Weakness: Secondary | ICD-10-CM | POA: Diagnosis not present

## 2022-01-18 DIAGNOSIS — R531 Weakness: Secondary | ICD-10-CM | POA: Diagnosis not present

## 2022-01-18 DIAGNOSIS — M545 Low back pain, unspecified: Secondary | ICD-10-CM | POA: Diagnosis not present

## 2022-01-21 DIAGNOSIS — R531 Weakness: Secondary | ICD-10-CM | POA: Diagnosis not present

## 2022-01-21 DIAGNOSIS — M545 Low back pain, unspecified: Secondary | ICD-10-CM | POA: Diagnosis not present

## 2022-01-25 DIAGNOSIS — M545 Low back pain, unspecified: Secondary | ICD-10-CM | POA: Diagnosis not present

## 2022-01-25 DIAGNOSIS — R531 Weakness: Secondary | ICD-10-CM | POA: Diagnosis not present

## 2022-01-27 DIAGNOSIS — R531 Weakness: Secondary | ICD-10-CM | POA: Diagnosis not present

## 2022-01-27 DIAGNOSIS — M545 Low back pain, unspecified: Secondary | ICD-10-CM | POA: Diagnosis not present

## 2022-02-01 DIAGNOSIS — R531 Weakness: Secondary | ICD-10-CM | POA: Diagnosis not present

## 2022-02-01 DIAGNOSIS — M545 Low back pain, unspecified: Secondary | ICD-10-CM | POA: Diagnosis not present

## 2022-04-07 ENCOUNTER — Other Ambulatory Visit: Payer: Self-pay

## 2022-04-07 MED ORDER — ESTRADIOL 0.1 MG/GM VA CREA: TOPICAL_CREAM | VAGINAL | 0 refills | Status: DC

## 2022-04-07 NOTE — Telephone Encounter (Signed)
Last AEX 05/05/21--scheduled 05/11/22. ?Lmammo-06/23/21-neg birads 1 ?

## 2022-04-08 ENCOUNTER — Other Ambulatory Visit: Payer: Self-pay

## 2022-04-08 NOTE — Telephone Encounter (Signed)
error 

## 2022-04-12 DIAGNOSIS — M795 Residual foreign body in soft tissue: Secondary | ICD-10-CM | POA: Diagnosis not present

## 2022-05-04 ENCOUNTER — Ambulatory Visit
Admission: RE | Admit: 2022-05-04 | Discharge: 2022-05-04 | Disposition: A | Discharge status: Home / Self Care | Payer: Medicare HMO | Source: Ambulatory Visit | Attending: Internal Medicine | Admitting: Internal Medicine

## 2022-05-04 ENCOUNTER — Other Ambulatory Visit: Payer: Self-pay | Admitting: Internal Medicine

## 2022-05-04 DIAGNOSIS — S93505A Unspecified sprain of left lesser toe(s), initial encounter: Secondary | ICD-10-CM

## 2022-05-04 DIAGNOSIS — S93602A Unspecified sprain of left foot, initial encounter: Secondary | ICD-10-CM | POA: Diagnosis not present

## 2022-05-10 NOTE — Progress Notes (Unsigned)
71 y.o. G24P2002 Divorced Caucasian female here for annual breast and pelvic exam.    Patient concerned she has a rectocele. Occasional urgency. States she pushes the rectocele back in.  Having bowel movements OK.   Voiding but not as much as she feels she needs to void.  States she has a good stream.  Rare urinary leakage.  No leak with cough, laugh or exercise.   Occasional dysuria.   Patient states she is looking for feedback.  She is more concerned about her rectal functioning.   PCP:  Lajean Manes, MD   Patient's last menstrual period was 02/09/2002.           Sexually active: No.  The current method of family planning is status post hysterectomy.    Exercising: Yes.     Silver sneakers, [employer], hiking Smoker:  no  Health Maintenance: Pap:  11-19-14 ASCUS:Neg HR HPV.  Final pathology for uterus/cervix 2015 - no dysplasia or atypia. History of abnormal Pap:  yes,  2008 hx of dysplasia with colposcopy but no treatment to cervix MMG:  06-23-21 Neg/Birads1 Colonoscopy:  2019 polyps;next 2024 BMD:  10-09-20  Result :Osteoporosis.  PCP following and prescribing Boniva.  TDaP:  05-01-19 Gardasil:   no HIV: never Hep C: never Screening Labs:  PCP   reports that she has never smoked. She has never used smokeless tobacco. She reports current alcohol use of about 2.0 standard drinks per week. She reports that she does not use drugs.  Past Medical History:  Diagnosis Date   Family history of colon cancer    Family history of pancreatic cancer    Hypothyroidism    Osteopenia    Thyroid disease    Hyperthyroid--had iodine therapy   Urinary incontinence    with sneezing, coughing    Past Surgical History:  Procedure Laterality Date   BLADDER SUSPENSION N/A 07/01/2014   Procedure:  TRANSVAGINAL TAPE (TVT) PROCEDURE,;  Surgeon: Jamey Reas de Berton Lan, MD;  Location: [facility];  Service: Gynecology;  Laterality: N/A;   CATARACT EXTRACTION Bilateral     CYSTOSCOPY N/A 07/01/2014   Procedure: CYSTOSCOPY;  Surgeon: Jamey Reas de Berton Lan, MD;  Location: [facility];  Service: Gynecology;  Laterality: N/A;   THORACIC OUTLET SURGERY     VAGINAL HYSTERECTOMY N/A 07/01/2014   Procedure: HYSTERECTOMY VAGINAL;  Surgeon: Jamey Reas de Berton Lan, MD;  Location: [facility];  Service: Gynecology;  Laterality: N/A;    Current Outpatient Medications  Medication Sig Dispense Refill   Calcium Citrate-Vitamin D (CVS CALCIUM CITRATE + D PO) Take 600 mg by mouth 2 (two) times daily.     Cetirizine HCl (ZYRTEC ALLERGY PO) Take by mouth as needed.     Cholecalciferol (VITAMIN D-3) 1000 UNITS CAPS Take 1,000 Units by mouth daily.      diclofenac Sodium (VOLTAREN) 1 % GEL apply left thumb     estradiol (ESTRACE) 0.1 MG/GM vaginal cream Use 1/2 g vaginally two or three times per week as needed to maintain symptom relief. 42.5 g 0   Fish Oil-Cholecalciferol (FISH OIL + D3) 1000-1000 MG-UNIT CAPS Take 1,000 mg by mouth daily.     ibandronate (BONIVA) 150 MG tablet Take 150 mg by mouth every 30 (thirty) days.     levothyroxine (SYNTHROID, LEVOTHROID) 25 MCG tablet Take 25 mcg by mouth daily.     Multiple Vitamin (MULTIVITAMIN WITH MINERALS) TABS tablet Take 1 tablet by mouth daily.  tacrolimus (PROTOPIC) 0.1 % ointment Apply topically 2 (two) times daily.     No current facility-administered medications for this visit.    Family History  Problem Relation Age of Onset   Pancreatic cancer Mother 61   Cancer Maternal Aunt 95       large intestine   Testicular cancer Maternal Uncle    Other Maternal Grandfather 45       intestien issues- unk if cancer   Cancer Maternal Uncle        leg- unk if bone or soft tissue?   Cancer Cousin        knee- bone cancer?    Review of Systems  Genitourinary:  Positive for urgency (only occ. urgency).  All other systems reviewed and are negative.  Exam:   BP 122/64   Pulse 63   Ht '5\' 2"'$  (1.575 m)    Wt 117 lb (53.1 kg)   LMP 02/09/2002   SpO2 95%   BMI 21.40 kg/m     General appearance: alert, cooperative and appears stated age Head: normocephalic, without obvious abnormality, atraumatic Neck: no adenopathy, supple, symmetrical, trachea midline and thyroid normal to inspection and palpation Lungs: clear to auscultation bilaterally Breasts: normal appearance, no masses or tenderness, No nipple retraction or dimpling, No nipple discharge or bleeding, No axillary adenopathy Heart: regular rate and rhythm Abdomen: soft, non-tender; no masses, no organomegaly Extremities: extremities normal, atraumatic, no cyanosis or edema Skin: skin color, texture, turgor normal. No rashes or lesions Lymph nodes: cervical, supraclavicular, and axillary nodes normal. Neurologic: grossly normal  Pelvic: External genitalia:  no lesions              No abnormal inguinal nodes palpated.              Urethra:  normal appearing urethra with no masses, tenderness or lesions              Bartholins and Skenes: normal                 Vagina: normal appearing vagina with normal color and discharge, no lesions              Cervix: absent              Pap taken: no Bimanual Exam:  Uterus:  normal size, contour, position, consistency, mobility, non-tender.  Good support.              Adnexa: no mass, fullness, tenderness              Rectal exam: Yes.  .  Confirms.              Anus:  normal sphincter tone, no lesions  Chaperone was present for exam:  Estill Bamberg, CMA  Assessment:   Well woman visit with gynecologic exam. Status post TVH/Anterior and Posterior Colporrhaphy, TVT/cysto.  Ovaries remain.  Excellent support. Osteoporosis.   On Boniva.   Vaginal atrophy.  Encounter for medication monitoring.  Dysuria.  Pelvic floor dysfunction.   Plan: Mammogram screening discussed. Self breast awareness reviewed. Pap and HR HPV as above. Guidelines for Calcium, Vitamin D, regular exercise program including  cardiovascular and weight bearing exercise. Refill of vaginal estrogen.  Potential effect on breast cancer discussed.  Urinalysis and reflex culture.  Referral for pelvic floor therapy.  Follow up annually and prn.   After visit summary provided.   21 min  total time was spent for this patient encounter, including preparation, face-to-face counseling  with the patient, coordination of care, and documentation of the encounter.

## 2022-05-11 ENCOUNTER — Telehealth: Payer: Self-pay | Admitting: Obstetrics and Gynecology

## 2022-05-11 ENCOUNTER — Ambulatory Visit (INDEPENDENT_AMBULATORY_CARE_PROVIDER_SITE_OTHER): Payer: Medicare HMO | Admitting: Obstetrics and Gynecology

## 2022-05-11 ENCOUNTER — Encounter: Payer: Self-pay | Admitting: Obstetrics and Gynecology

## 2022-05-11 VITALS — BP 122/64 | HR 63 | Ht 62.0 in | Wt 117.0 lb

## 2022-05-11 DIAGNOSIS — Z01419 Encounter for gynecological examination (general) (routine) without abnormal findings: Secondary | ICD-10-CM | POA: Diagnosis not present

## 2022-05-11 DIAGNOSIS — N952 Postmenopausal atrophic vaginitis: Secondary | ICD-10-CM

## 2022-05-11 DIAGNOSIS — M6289 Other specified disorders of muscle: Secondary | ICD-10-CM | POA: Diagnosis not present

## 2022-05-11 DIAGNOSIS — Z5181 Encounter for therapeutic drug level monitoring: Secondary | ICD-10-CM | POA: Diagnosis not present

## 2022-05-11 DIAGNOSIS — R300 Dysuria: Secondary | ICD-10-CM | POA: Diagnosis not present

## 2022-05-11 MED ORDER — ESTRADIOL 0.1 MG/GM VA CREA: TOPICAL_CREAM | VAGINAL | 1 refills | Status: DC

## 2022-05-11 NOTE — Telephone Encounter (Signed)
Referral Sent

## 2022-05-11 NOTE — Telephone Encounter (Signed)
Please refer patient for pelvic floor therapy at [facility] due to pelvic floor dysfunction.   Patient has urgency and feels vaginal prolapse.

## 2022-05-12 DIAGNOSIS — M810 Age-related osteoporosis without current pathological fracture: Secondary | ICD-10-CM | POA: Diagnosis not present

## 2022-05-12 DIAGNOSIS — S92505A Nondisplaced unspecified fracture of left lesser toe(s), initial encounter for closed fracture: Secondary | ICD-10-CM | POA: Diagnosis not present

## 2022-05-13 ENCOUNTER — Other Ambulatory Visit: Payer: Self-pay | Admitting: *Deleted

## 2022-05-13 MED ORDER — SULFAMETHOXAZOLE-TRIMETHOPRIM 800-160 MG PO TABS: 1.0000 | ORAL_TABLET | Freq: Two times a day (BID) | ORAL | 0 refills | Status: DC

## 2022-05-13 NOTE — Patient Instructions (Signed)

## 2022-05-14 LAB — URINALYSIS, COMPLETE W/RFL CULTURE
Bilirubin Urine: NEGATIVE
Glucose, UA: NEGATIVE
Hgb urine dipstick: NEGATIVE
Hyaline Cast: NONE SEEN /LPF
Ketones, ur: NEGATIVE
Nitrites, Initial: NEGATIVE
Protein, ur: NEGATIVE
RBC / HPF: NONE SEEN /HPF (ref 0–2)
Specific Gravity, Urine: 1.016 (ref 1.001–1.035)
pH: 7 (ref 5.0–8.0)

## 2022-05-14 LAB — CULTURE INDICATED

## 2022-05-14 LAB — URINE CULTURE
MICRO NUMBER:: 13467822
SPECIMEN QUALITY:: ADEQUATE

## 2022-05-16 ENCOUNTER — Encounter: Payer: Self-pay | Admitting: Obstetrics and Gynecology

## 2022-05-16 ENCOUNTER — Telehealth: Payer: Self-pay | Admitting: *Deleted

## 2022-05-16 NOTE — Telephone Encounter (Signed)
Encounter reviewed and closed.  

## 2022-05-16 NOTE — Telephone Encounter (Signed)
Per Dr.Silva "Please let patient know that the Bactrim DS should work to treat her bladder infection.  I hope she is feeling better. "  I called patient and left detailed message on voicemail. I asked her to call if she is not feeling better ASAP.

## 2022-05-16 NOTE — Telephone Encounter (Signed)
Dr.Silva patient was prescribed Bactrim DS on 05/13/22.  Told once urine culture sensitives return you will let her know if Rx should be switched patient will be leaving the country tomorrow and would like to know if she is on correct Rx? Please advise

## 2022-05-17 MED ORDER — SULFAMETHOXAZOLE-TRIMETHOPRIM 800-160 MG PO TABS: 1.0000 | ORAL_TABLET | Freq: Two times a day (BID) | ORAL | 0 refills | Status: DC

## 2022-05-17 NOTE — Telephone Encounter (Signed)
I called patient and informed her of the below. Rx sent to pharmacy located in my chart message.

## 2022-05-17 NOTE — Telephone Encounter (Signed)
I called patient and she only noticed a little urgency yesterday, no other symptoms. This am she doesn't feel as much urgency as yesterday. Patient is currently in [place] will be leaving out of the country later today. Was treated with 3 day BID dose of Bactrim on 05/13/22. Please see above message she just wanted to make sure you don't another round of medication should be sent in.

## 2022-05-17 NOTE — Telephone Encounter (Signed)
Please send an Rx for Bactrim DS, one po bid x 3 days to her pharmacy in [place].

## 2022-05-24 NOTE — Telephone Encounter (Signed)
Per Referral: KM from PFT LVM for pt to return call on 05/11/22.

## 2022-06-02 NOTE — Telephone Encounter (Signed)
Referral was closed on 05/25/22 by PT office due to unsuccessful attempts of reaching/scheduling pt. Will send mychart msg to pt to f/u.

## 2022-06-03 NOTE — Telephone Encounter (Signed)
Encounter reviewed and closed.  

## 2022-06-06 ENCOUNTER — Other Ambulatory Visit: Payer: Self-pay | Admitting: Obstetrics and Gynecology

## 2022-06-06 DIAGNOSIS — Z1231 Encounter for screening mammogram for malignant neoplasm of breast: Secondary | ICD-10-CM

## 2022-06-07 ENCOUNTER — Encounter: Payer: Self-pay | Admitting: Physical Therapy

## 2022-06-07 ENCOUNTER — Ambulatory Visit: Payer: Medicare HMO | Attending: Obstetrics and Gynecology | Admitting: Physical Therapy

## 2022-06-07 ENCOUNTER — Other Ambulatory Visit: Payer: Self-pay

## 2022-06-07 DIAGNOSIS — M6281 Muscle weakness (generalized): Secondary | ICD-10-CM | POA: Insufficient documentation

## 2022-06-07 DIAGNOSIS — N905 Atrophy of vulva: Secondary | ICD-10-CM | POA: Diagnosis not present

## 2022-06-07 DIAGNOSIS — R279 Unspecified lack of coordination: Secondary | ICD-10-CM | POA: Diagnosis not present

## 2022-06-07 DIAGNOSIS — E785 Hyperlipidemia, unspecified: Secondary | ICD-10-CM | POA: Diagnosis not present

## 2022-06-07 DIAGNOSIS — Z791 Long term (current) use of non-steroidal anti-inflammatories (NSAID): Secondary | ICD-10-CM | POA: Diagnosis not present

## 2022-06-07 DIAGNOSIS — G8929 Other chronic pain: Secondary | ICD-10-CM | POA: Diagnosis not present

## 2022-06-07 DIAGNOSIS — M6289 Other specified disorders of muscle: Secondary | ICD-10-CM | POA: Diagnosis not present

## 2022-06-07 DIAGNOSIS — Z800 Family history of malignant neoplasm of digestive organs: Secondary | ICD-10-CM | POA: Diagnosis not present

## 2022-06-07 DIAGNOSIS — Z91018 Allergy to other foods: Secondary | ICD-10-CM | POA: Diagnosis not present

## 2022-06-07 DIAGNOSIS — N952 Postmenopausal atrophic vaginitis: Secondary | ICD-10-CM | POA: Diagnosis not present

## 2022-06-07 DIAGNOSIS — M810 Age-related osteoporosis without current pathological fracture: Secondary | ICD-10-CM | POA: Diagnosis not present

## 2022-06-07 DIAGNOSIS — Z7983 Long term (current) use of bisphosphonates: Secondary | ICD-10-CM | POA: Diagnosis not present

## 2022-06-07 DIAGNOSIS — E039 Hypothyroidism, unspecified: Secondary | ICD-10-CM | POA: Diagnosis not present

## 2022-06-07 NOTE — Therapy (Signed)
OUTPATIENT PHYSICAL THERAPY FEMALE PELVIC EVALUATION   Patient Name: Crystal Carr MRN: [id number] DOB:[birth year], 71 y.o., female Today's Date: 06/07/2022   PT End of Session - 06/07/22 1242     Visit Number 1    Date for PT Re-Evaluation 09/07/22    Authorization Type aetna medicare    PT Start Time 1145    PT Stop Time 1231    PT Time Calculation (min) 46 min    Activity Tolerance Patient tolerated treatment well    Behavior During Therapy WFL for tasks assessed/performed             Past Medical History:  Diagnosis Date   Family history of colon cancer    Family history of pancreatic cancer    Hypothyroidism    Osteopenia    Thyroid disease    Hyperthyroid--had iodine therapy   Urinary incontinence    with sneezing, coughing   Past Surgical History:  Procedure Laterality Date   BLADDER SUSPENSION N/A 07/01/2014   Procedure:  TRANSVAGINAL TAPE (TVT) PROCEDURE,;  Surgeon: Jacqualin Combes de Gwenevere Ghazi, MD;  Location: [facility];  Service: Gynecology;  Laterality: N/A;   CATARACT EXTRACTION Bilateral    CYSTOSCOPY N/A 07/01/2014   Procedure: CYSTOSCOPY;  Surgeon: Jacqualin Combes de Gwenevere Ghazi, MD;  Location: [facility];  Service: Gynecology;  Laterality: N/A;   THORACIC OUTLET SURGERY     VAGINAL HYSTERECTOMY N/A 07/01/2014   Procedure: HYSTERECTOMY VAGINAL;  Surgeon: Jacqualin Combes de Gwenevere Ghazi, MD;  Location: [facility];  Service: Gynecology;  Laterality: N/A;   Patient Active Problem List   Diagnosis Date Noted   Family history of pancreatic cancer    Family history of colon cancer     PCP: Merlene Laughter, MD  REFERRING PROVIDER: Patton Salles, MD  REFERRING DIAG: [phone] (ICD-10-CM) - Pelvic floor dysfunction  THERAPY DIAG:  Muscle weakness (generalized)  Unspecified lack of coordination  Rationale for Evaluation and Treatment Rehabilitation  ONSET DATE: 2016  SUBJECTIVE:                                                                                                                                                                                            SUBJECTIVE STATEMENT: Pt reports she had a hysterectomy 2015 and since has had worsening rectocele symptoms.      PAIN:  Are you having pain? No   PRECAUTIONS: None  WEIGHT BEARING RESTRICTIONS No  FALLS:  Has patient fallen in last 6 months? No  LIVING ENVIRONMENT: Lives with: lives with their family Lives in: House/apartment   OCCUPATION: retired  PLOF: Independent  PATIENT GOALS to have less prolapse symptoms  PERTINENT HISTORY:  Vaginal hysterectomy, bladder suspension, cystoscopy 2015 Sexual abuse: No  BOWEL MOVEMENT Pain with bowel movement: No Type of bowel movement:Type (Bristol Stool Scale) 4, Frequency daily, and Strain No Fully empty rectum: Yes:   Leakage: No Pads: No Fiber supplement: No  URINATION Pain with urination: No Fully empty bladder: Yes:   Stream: Strong Urgency: Yes: sometimes based on what she drinks, was worse but had a bladder infection Frequency: 3-4 hours Leakage:  very rarely with very strong urgency.  Pads: No  INTERCOURSE Pain with intercourse:  not active Ability to have vaginal penetration:  Yes:   Dryness noted   PREGNANCY Vaginal deliveries 2 Tearing Yes: episiotomy both    C-section deliveries 0 Currently pregnant No  PROLAPSE Rectocele reports "something is there at rectum, not outside but there."  Like a tampon there.    OBJECTIVE:   DIAGNOSTIC FINDINGS:    COGNITION:  Overall cognitive status: Within functional limits for tasks assessed     SENSATION:  Light touch: Appears intact  Proprioception: Appears intact  MUSCLE LENGTH: Bil hamstrings and adductors limited by 25%                POSTURE: No Significant postural limitations   PELVIC ALIGNMENT:  LUMBARAROM/PROM  WFL  LOWER EXTREMITY ROM:  Bil WFL  LOWER EXTREMITY MMT:  Grossly bil hips 4/5; knees  5/5 ankles 5/5   PALPATION:   General  no TTP                External Perineal Exam no TTP, mild dryness noted                             Internal Pelvic Floor  no TTP  Patient confirms identification and approves PT to assess internal pelvic floor and treatment Yes  PELVIC MMT:   MMT eval  Vaginal 4/5; 9s isometric; 3 reps  Internal Anal Sphincter   External Anal Sphincter   Puborectalis   Diastasis Recti   (Blank rows = not tested)        TONE: WFL  PROLAPSE: Mild posterior wall laxity in hooklying with strong cough  TODAY'S TREATMENT  06/07/22 EVAL Examination completed, findings reviewed, pt educated on POC, HEP, bladder irritants, feminine moisturizers. Pt motivated to participate in PT and agreeable to attempt recommendations.     PATIENT EDUCATION:  Education details: [id number] Person educated: Patient Education method: Explanation, Demonstration, Actor cues, Verbal cues, and Handouts Education comprehension: verbalized understanding and returned demonstration   HOME EXERCISE PROGRAM: [id number]  ASSESSMENT:  CLINICAL IMPRESSION: Patient is a 71 y.o. female  who was seen today for physical therapy evaluation and treatment for rectocele and urinary urgency. Urgency has somewhat improved now as she did have a bladder infection that has since been treated. Pt reports she mostly feels like "something is there" at rectum at the end of the day, after doing a lot during the day. Pt found to have mild bil hip weakness, mild core weakness, no pain or TTP during exam. Pt consented to internal vaginal assessment this date and found to have decreased strength, coordination, and endurance and benefited from mild verbal cues for techniques to insure good technique for home with HEP. Pt would benefit from additional PT to further address deficits for improved pressure management as pt did have held breath prior to cuing.  OBJECTIVE IMPAIRMENTS decreased coordination,  decreased endurance, decreased strength, and improper body mechanics.   ACTIVITY LIMITATIONS carrying, lifting, and continence  PARTICIPATION LIMITATIONS: community activity  PERSONAL FACTORS Time since onset of injury/illness/exacerbation and 1 comorbidity: history of hysterectomy   are also affecting patient's functional outcome.   REHAB POTENTIAL: Good  CLINICAL DECISION MAKING: Stable/uncomplicated  EVALUATION COMPLEXITY: Low   GOALS: Goals reviewed with patient? Yes  SHORT TERM GOALS: Target date: 07/05/2022  Pt to be I with HEP.  Baseline: Goal status: INITIAL  2.  Pt will report her BMs and urine voids are complete within good mechanics due to improved voiding habits and evacuation techniques without straining at pelvic floor or prolapse.  Baseline:  Goal status: INITIAL  3.  Pt to demonstrate improved coordination of pelvic floor and breathing mechanics with body weight squat to decrease strain at pelvic floor and prolapse. Baseline:  Goal status: INITIAL   LONG TERM GOALS: Target date:  09/07/22    Pt to be I with advanced HEP.  Baseline:  Goal status: INITIAL  2.  Pt to demonstrate at least 5/5 bil hip strength for improved pelvic stability and functional squats without leakage.  Baseline:  Goal status: INITIAL  3.  Pt to demonstrate improved coordination of pelvic floor and breathing mechanics with 20# squat to decrease strain at pelvic floor and prolapse. Baseline:  Goal status: INITIAL  4.  Pt to report at least 75% reduction in prolapse symptoms compared to prior to PT for improved QOL.  Baseline:  Goal status: INITIAL   PLAN: PT FREQUENCY:  1x every other week  PT DURATION:  6 sessions  PLANNED INTERVENTIONS: Therapeutic exercises, Therapeutic activity, Neuromuscular re-education, Patient/Family education, Joint mobilization, Dry Needling, Spinal mobilization, Cryotherapy, Moist heat, scar mobilization, Taping, Biofeedback, and Manual  therapy  PLAN FOR NEXT SESSION: coordination of pelvic floor and breathing mechanics with strengthening, voiding and breathing mechanics     Otelia Sergeant, PT, DPT 06/08/2311:45 PM

## 2022-06-23 ENCOUNTER — Encounter: Payer: Medicare HMO | Admitting: Physical Therapy

## 2022-06-24 ENCOUNTER — Ambulatory Visit
Admission: RE | Admit: 2022-06-24 | Discharge: 2022-06-24 | Disposition: A | Discharge status: Home / Self Care | Payer: Medicare HMO | Source: Ambulatory Visit | Attending: Obstetrics and Gynecology | Admitting: Obstetrics and Gynecology

## 2022-06-24 DIAGNOSIS — Z1231 Encounter for screening mammogram for malignant neoplasm of breast: Secondary | ICD-10-CM

## 2022-07-07 ENCOUNTER — Ambulatory Visit: Payer: Medicare HMO | Attending: Obstetrics and Gynecology | Admitting: Physical Therapy

## 2022-07-07 DIAGNOSIS — M6281 Muscle weakness (generalized): Secondary | ICD-10-CM | POA: Diagnosis not present

## 2022-07-07 DIAGNOSIS — R279 Unspecified lack of coordination: Secondary | ICD-10-CM | POA: Diagnosis not present

## 2022-07-07 NOTE — Therapy (Signed)
OUTPATIENT PHYSICAL THERAPY FEMALE PELVIC EVALUATION   Patient Name: Crystal Carr MRN: [id number] DOB:[birth year], 71 y.o., female Today's Date: 07/07/2022   PT End of Session - 07/07/22 1108     Visit Number 2    Date for PT Re-Evaluation 09/07/22    Authorization Type aetna medicare    PT Start Time 1107   arrival   PT Stop Time 1145    PT Time Calculation (min) 38 min    Activity Tolerance Patient tolerated treatment well    Behavior During Therapy WFL for tasks assessed/performed             Past Medical History:  Diagnosis Date   Family history of colon cancer    Family history of pancreatic cancer    Hypothyroidism    Osteopenia    Thyroid disease    Hyperthyroid--had iodine therapy   Urinary incontinence    with sneezing, coughing   Past Surgical History:  Procedure Laterality Date   BLADDER SUSPENSION N/A 07/01/2014   Procedure:  TRANSVAGINAL TAPE (TVT) PROCEDURE,;  Surgeon: Jamey Reas de Berton Lan, MD;  Location: [facility];  Service: Gynecology;  Laterality: N/A;   CATARACT EXTRACTION Bilateral    CYSTOSCOPY N/A 07/01/2014   Procedure: CYSTOSCOPY;  Surgeon: Jamey Reas de Berton Lan, MD;  Location: [facility];  Service: Gynecology;  Laterality: N/A;   THORACIC OUTLET SURGERY     VAGINAL HYSTERECTOMY N/A 07/01/2014   Procedure: HYSTERECTOMY VAGINAL;  Surgeon: Jamey Reas de Berton Lan, MD;  Location: [facility];  Service: Gynecology;  Laterality: N/A;   Patient Active Problem List   Diagnosis Date Noted   Family history of pancreatic cancer    Family history of colon cancer     PCP: Lajean Manes, MD  REFERRING PROVIDER: Nunzio Cobbs, MD  REFERRING DIAG: [phone] (ICD-10-CM) - Pelvic floor dysfunction  THERAPY DIAG:  Muscle weakness (generalized)  Unspecified lack of coordination  Rationale for Evaluation and Treatment Rehabilitation  ONSET DATE: 2016  SUBJECTIVE:                                                                                                                                                                                            SUBJECTIVE STATEMENT: Pt reports she has noticed an improvement with urinary urgency. Rectocele symptoms abut the same.   PAIN:  Are you having pain? No   PRECAUTIONS: None  WEIGHT BEARING RESTRICTIONS No  FALLS:  Has patient fallen in last 6 months? No  LIVING ENVIRONMENT: Lives with: lives with their family Lives in: House/apartment   OCCUPATION: retired  PLOF: Independent  PATIENT GOALS to have less prolapse symptoms  PERTINENT HISTORY:  Vaginal hysterectomy, bladder suspension, cystoscopy 2015 Sexual abuse: No  BOWEL MOVEMENT Pain with bowel movement: No Type of bowel movement:Type (Bristol Stool Scale) 4, Frequency daily, and Strain No Fully empty rectum: Yes:   Leakage: No Pads: No Fiber supplement: No  URINATION Pain with urination: No Fully empty bladder: Yes:   Stream: Strong Urgency: Yes: sometimes based on what she drinks, was worse but had a bladder infection Frequency: 3-4 hours Leakage:  very rarely with very strong urgency.  Pads: No  INTERCOURSE Pain with intercourse:  not active Ability to have vaginal penetration:  Yes:   Dryness noted   PREGNANCY Vaginal deliveries 2 Tearing Yes: episiotomy both    C-section deliveries 0 Currently pregnant No  PROLAPSE Rectocele reports "something is there at rectum, not outside but there."  Like a tampon there.    OBJECTIVE:   DIAGNOSTIC FINDINGS:    COGNITION:  Overall cognitive status: Within functional limits for tasks assessed     SENSATION:  Light touch: Appears intact  Proprioception: Appears intact  MUSCLE LENGTH: Bil hamstrings and adductors limited by 25%                POSTURE: No Significant postural limitations   PELVIC ALIGNMENT:  LUMBARAROM/PROM  WFL  LOWER EXTREMITY ROM:  Bil WFL  LOWER EXTREMITY MMT:  Grossly bil  hips 4/5; knees 5/5 ankles 5/5   PALPATION:   General  no TTP                External Perineal Exam no TTP, mild dryness noted                             Internal Pelvic Floor  no TTP  Patient confirms identification and approves PT to assess internal pelvic floor and treatment Yes  PELVIC MMT:   MMT eval  Vaginal 4/5; 9s isometric; 3 reps  Internal Anal Sphincter   External Anal Sphincter   Puborectalis   Diastasis Recti   (Blank rows = not tested)        TONE: WFL  PROLAPSE: Mild posterior wall laxity in hooklying with strong cough  TODAY'S TREATMENT   07/07/22: NMRE: all exercises cued for coordination of breathing and pelvic floor and core activations 2x10 bridges with ball squeeze 2x10 cat/cows Childs pose 2x1 mins Sit to stand from mat table blue band 2x10 Self-care: pt educated on pressure management, voiding and breathing mechanics    PATIENT EDUCATION:  Education details: Hotel manager Person educated: Patient Education method: Consulting civil engineer, Media planner, Corporate treasurer cues, Verbal cues, and Handouts Education comprehension: verbalized understanding and returned demonstration   HOME EXERCISE PROGRAM: [id number]  ASSESSMENT:  CLINICAL IMPRESSION: Patient session focused on hip and core strengthening exercises to coordination with pelvic floor and breathing mechanics for decreased stain at prolapse. Pt would benefit from additional PT to further address deficits for improved pressure management as pt did have held breath prior to cuing.     OBJECTIVE IMPAIRMENTS decreased coordination, decreased endurance, decreased strength, and improper body mechanics.   ACTIVITY LIMITATIONS carrying, lifting, and continence  PARTICIPATION LIMITATIONS: community activity  PERSONAL FACTORS Time since onset of injury/illness/exacerbation and 1 comorbidity: history of hysterectomy   are also affecting patient's functional outcome.   REHAB POTENTIAL: Good  CLINICAL DECISION  MAKING: Stable/uncomplicated  EVALUATION COMPLEXITY: Low   GOALS: Goals reviewed with patient? Yes  SHORT  TERM GOALS: Target date: 07/05/2022  Pt to be I with HEP.  Baseline: Goal status: INITIAL  2.  Pt will report her BMs and urine voids are complete within good mechanics due to improved voiding habits and evacuation techniques without straining at pelvic floor or prolapse.  Baseline:  Goal status: INITIAL  3.  Pt to demonstrate improved coordination of pelvic floor and breathing mechanics with body weight squat to decrease strain at pelvic floor and prolapse. Baseline:  Goal status: INITIAL   LONG TERM GOALS: Target date:  09/07/22    Pt to be I with advanced HEP.  Baseline:  Goal status: INITIAL  2.  Pt to demonstrate at least 5/5 bil hip strength for improved pelvic stability and functional squats without leakage.  Baseline:  Goal status: INITIAL  3.  Pt to demonstrate improved coordination of pelvic floor and breathing mechanics with 20# squat to decrease strain at pelvic floor and prolapse. Baseline:  Goal status: INITIAL  4.  Pt to report at least 75% reduction in prolapse symptoms compared to prior to PT for improved QOL.  Baseline:  Goal status: INITIAL   PLAN: PT FREQUENCY:  1x every other week  PT DURATION:  6 sessions  PLANNED INTERVENTIONS: Therapeutic exercises, Therapeutic activity, Neuromuscular re-education, Patient/Family education, Joint mobilization, Dry Needling, Spinal mobilization, Cryotherapy, Moist heat, scar mobilization, Taping, Biofeedback, and Manual therapy  PLAN FOR NEXT SESSION: coordination of pelvic floor and breathing mechanics with strengthening, voiding and breathing mechanics     Stacy Gardner, PT, DPT 07/07/2310:57 AM

## 2022-07-21 ENCOUNTER — Ambulatory Visit: Payer: Medicare HMO | Attending: Obstetrics and Gynecology | Admitting: Physical Therapy

## 2022-07-21 DIAGNOSIS — R279 Unspecified lack of coordination: Secondary | ICD-10-CM | POA: Diagnosis not present

## 2022-07-21 DIAGNOSIS — M6281 Muscle weakness (generalized): Secondary | ICD-10-CM | POA: Diagnosis not present

## 2022-07-21 NOTE — Therapy (Signed)
OUTPATIENT PHYSICAL THERAPY FEMALE PELVIC EVALUATION   Patient Name: Crystal Carr MRN: [id number] DOB:1951/07/03, 71 y.o., female Today's Date: 07/21/2022   PT End of Session - 07/21/22 1140     Visit Number 3    Date for PT Re-Evaluation 09/07/22    Authorization Type aetna medicare    PT Start Time 1102    PT Stop Time 1140    PT Time Calculation (min) 38 min    Activity Tolerance Patient tolerated treatment well    Behavior During Therapy WFL for tasks assessed/performed              Past Medical History:  Diagnosis Date   Family history of colon cancer    Family history of pancreatic cancer    Hypothyroidism    Osteopenia    Thyroid disease    Hyperthyroid--had iodine therapy   Urinary incontinence    with sneezing, coughing   Past Surgical History:  Procedure Laterality Date   BLADDER SUSPENSION N/A 07/01/2014   Procedure:  TRANSVAGINAL TAPE (TVT) PROCEDURE,;  Surgeon: Jamey Reas de Berton Lan, MD;  Location: [facility];  Service: Gynecology;  Laterality: N/A;   CATARACT EXTRACTION Bilateral    CYSTOSCOPY N/A 07/01/2014   Procedure: CYSTOSCOPY;  Surgeon: Jamey Reas de Berton Lan, MD;  Location: [facility];  Service: Gynecology;  Laterality: N/A;   THORACIC OUTLET SURGERY     VAGINAL HYSTERECTOMY N/A 07/01/2014   Procedure: HYSTERECTOMY VAGINAL;  Surgeon: Jamey Reas de Berton Lan, MD;  Location: [facility];  Service: Gynecology;  Laterality: N/A;   Patient Active Problem List   Diagnosis Date Noted   Family history of pancreatic cancer    Family history of colon cancer     PCP: Lajean Manes, MD  REFERRING PROVIDER: Nunzio Cobbs, MD  REFERRING DIAG: [phone] (ICD-10-CM) - Pelvic floor dysfunction  THERAPY DIAG:  Muscle weakness (generalized)  Unspecified lack of coordination  Rationale for Evaluation and Treatment Rehabilitation  ONSET DATE: 2016  SUBJECTIVE:                                                                                                                                                                                            SUBJECTIVE STATEMENT: Pt reports she has noticed an improvement with urinary urgency. Rectocele symptoms abut the same.   PAIN:  Are you having pain? No   PRECAUTIONS: None  WEIGHT BEARING RESTRICTIONS No  FALLS:  Has patient fallen in last 6 months? No  LIVING ENVIRONMENT: Lives with: lives with their family Lives in: House/apartment   OCCUPATION: retired  PLOF: Independent  PATIENT GOALS to have less prolapse symptoms  PERTINENT HISTORY:  Vaginal hysterectomy, bladder suspension, cystoscopy 2015 Sexual abuse: No  BOWEL MOVEMENT Pain with bowel movement: No Type of bowel movement:Type (Bristol Stool Scale) 4, Frequency daily, and Strain No Fully empty rectum: Yes:   Leakage: No Pads: No Fiber supplement: No  URINATION Pain with urination: No Fully empty bladder: Yes:   Stream: Strong Urgency: Yes: sometimes based on what she drinks, was worse but had a bladder infection Frequency: 3-4 hours Leakage:  very rarely with very strong urgency.  Pads: No  INTERCOURSE Pain with intercourse:  not active Ability to have vaginal penetration:  Yes:   Dryness noted   PREGNANCY Vaginal deliveries 2 Tearing Yes: episiotomy both    C-section deliveries 0 Currently pregnant No  PROLAPSE Rectocele reports "something is there at rectum, not outside but there."  Like a tampon there.    OBJECTIVE:   DIAGNOSTIC FINDINGS:    COGNITION:  Overall cognitive status: Within functional limits for tasks assessed     SENSATION:  Light touch: Appears intact  Proprioception: Appears intact  MUSCLE LENGTH: Bil hamstrings and adductors limited by 25%                POSTURE: No Significant postural limitations   PELVIC ALIGNMENT:  LUMBARAROM/PROM  WFL  LOWER EXTREMITY ROM:  Bil WFL  LOWER EXTREMITY MMT:  Grossly bil hips 4/5;  knees 5/5 ankles 5/5   PALPATION:   General  no TTP                External Perineal Exam no TTP, mild dryness noted                             Internal Pelvic Floor  no TTP  Patient confirms identification and approves PT to assess internal pelvic floor and treatment Yes  PELVIC MMT:   MMT eval  Vaginal 4/5; 9s isometric; 3 reps  Internal Anal Sphincter   External Anal Sphincter   Puborectalis   Diastasis Recti   (Blank rows = not tested)        TONE: WFL  PROLAPSE: Mild posterior wall laxity in hooklying with strong cough  TODAY'S TREATMENT   07/21/2022: NMRE: all exercises cued for coordination of breathing and pelvic floor and core activations 2x10 bridges with ball squeeze 2x10 hip abduction with ball press 2x10 cat/cows 2x10 pelvic tilt  2x10 wall push ups                        07/07/22: NMRE: all exercises cued for coordination of breathing and pelvic floor and core activations 2x10 bridges with ball squeeze 2x10 cat/cows Ardine Eng pose 2x1 mins Sit to stand from mat table blue band 2x10 Self-care: pt educated on pressure management, voiding and breathing mechanics    PATIENT EDUCATION:  Education details: Hotel manager Person educated: Patient Education method: Consulting civil engineer, Media planner, Corporate treasurer cues, Verbal cues, and Handouts Education comprehension: verbalized understanding and returned demonstration   HOME EXERCISE PROGRAM: [id number]  ASSESSMENT:  CLINICAL IMPRESSION: Patient session focused on hip and core strengthening exercises to coordination with pelvic floor and breathing mechanics for decreased stain at prolapse. Pt demonstrated improved coordination with more awareness and minimal cues. Pt would benefit from additional PT to further address deficits for improved pressure management as pt did have held breath prior to cuing.     OBJECTIVE IMPAIRMENTS decreased  coordination, decreased endurance, decreased strength, and improper body  mechanics.   ACTIVITY LIMITATIONS carrying, lifting, and continence  PARTICIPATION LIMITATIONS: community activity  PERSONAL FACTORS Time since onset of injury/illness/exacerbation and 1 comorbidity: history of hysterectomy   are also affecting patient's functional outcome.   REHAB POTENTIAL: Good  CLINICAL DECISION MAKING: Stable/uncomplicated  EVALUATION COMPLEXITY: Low   GOALS: Goals reviewed with patient? Yes  SHORT TERM GOALS: Target date: 07/05/2022  Pt to be I with HEP.  Baseline: Goal status: INITIAL  2.  Pt will report her BMs and urine voids are complete within good mechanics due to improved voiding habits and evacuation techniques without straining at pelvic floor or prolapse.  Baseline:  Goal status: INITIAL  3.  Pt to demonstrate improved coordination of pelvic floor and breathing mechanics with body weight squat to decrease strain at pelvic floor and prolapse. Baseline:  Goal status: INITIAL   LONG TERM GOALS: Target date:  09/07/22    Pt to be I with advanced HEP.  Baseline:  Goal status: INITIAL  2.  Pt to demonstrate at least 5/5 bil hip strength for improved pelvic stability and functional squats without leakage.  Baseline:  Goal status: INITIAL  3.  Pt to demonstrate improved coordination of pelvic floor and breathing mechanics with 20# squat to decrease strain at pelvic floor and prolapse. Baseline:  Goal status: INITIAL  4.  Pt to report at least 75% reduction in prolapse symptoms compared to prior to PT for improved QOL.  Baseline:  Goal status: INITIAL   PLAN: PT FREQUENCY:  1x every other week  PT DURATION:  6 sessions  PLANNED INTERVENTIONS: Therapeutic exercises, Therapeutic activity, Neuromuscular re-education, Patient/Family education, Joint mobilization, Dry Needling, Spinal mobilization, Cryotherapy, Moist heat, scar mobilization, Taping, Biofeedback, and Manual therapy  PLAN FOR NEXT SESSION: coordination of pelvic floor and  breathing mechanics with strengthening, voiding and breathing mechanics     Stacy Gardner, PT, DPT 07/21/2310:40 AM

## 2022-07-27 DIAGNOSIS — L989 Disorder of the skin and subcutaneous tissue, unspecified: Secondary | ICD-10-CM | POA: Diagnosis not present

## 2022-08-02 DIAGNOSIS — H524 Presbyopia: Secondary | ICD-10-CM | POA: Diagnosis not present

## 2022-08-02 DIAGNOSIS — Z961 Presence of intraocular lens: Secondary | ICD-10-CM | POA: Diagnosis not present

## 2022-08-04 ENCOUNTER — Ambulatory Visit: Payer: Medicare HMO | Admitting: Physical Therapy

## 2022-08-18 ENCOUNTER — Ambulatory Visit: Payer: Medicare HMO | Attending: Obstetrics and Gynecology | Admitting: Physical Therapy

## 2022-08-18 DIAGNOSIS — R279 Unspecified lack of coordination: Secondary | ICD-10-CM | POA: Insufficient documentation

## 2022-08-18 DIAGNOSIS — M6281 Muscle weakness (generalized): Secondary | ICD-10-CM | POA: Insufficient documentation

## 2022-08-18 NOTE — Therapy (Signed)
OUTPATIENT PHYSICAL THERAPY FEMALE PELVIC EVALUATION   Patient Name: Crystal Carr MRN: [id number] DOB:19-Jun-1951, 71 y.o., female Today's Date: 08/18/2022   PT End of Session - 08/18/22 1058     Visit Number 4    Date for PT Re-Evaluation 10/18/22    Authorization Type aetna medicare    PT Start Time 1100    PT Stop Time 1139    PT Time Calculation (min) 39 min    Activity Tolerance Patient tolerated treatment well    Behavior During Therapy WFL for tasks assessed/performed              Past Medical History:  Diagnosis Date   Family history of colon cancer    Family history of pancreatic cancer    Hypothyroidism    Osteopenia    Thyroid disease    Hyperthyroid--had iodine therapy   Urinary incontinence    with sneezing, coughing   Past Surgical History:  Procedure Laterality Date   BLADDER SUSPENSION N/A 07/01/2014   Procedure:  TRANSVAGINAL TAPE (TVT) PROCEDURE,;  Surgeon: Jamey Reas de Berton Lan, MD;  Location: [facility];  Service: Gynecology;  Laterality: N/A;   CATARACT EXTRACTION Bilateral    CYSTOSCOPY N/A 07/01/2014   Procedure: CYSTOSCOPY;  Surgeon: Jamey Reas de Berton Lan, MD;  Location: [facility];  Service: Gynecology;  Laterality: N/A;   THORACIC OUTLET SURGERY     VAGINAL HYSTERECTOMY N/A 07/01/2014   Procedure: HYSTERECTOMY VAGINAL;  Surgeon: Jamey Reas de Berton Lan, MD;  Location: [facility];  Service: Gynecology;  Laterality: N/A;   Patient Active Problem List   Diagnosis Date Noted   Family history of pancreatic cancer    Family history of colon cancer     PCP: Lajean Manes, MD  REFERRING PROVIDER: Nunzio Cobbs, MD  REFERRING DIAG: [phone] (ICD-10-CM) - Pelvic floor dysfunction  THERAPY DIAG:  Muscle weakness (generalized)  Unspecified lack of coordination  Rationale for Evaluation and Treatment Rehabilitation  ONSET DATE: 2016  SUBJECTIVE:                                                                                                                                                                                            SUBJECTIVE STATEMENT: Pt reports "I can tell I'm improving", nighttime urination has resolved now. Prolapse symptoms have been better and feels like she is getting stronger but notices symptoms worse at end of day and a lot standing and can feel it with bowel movements and feels like she can push it back with cleaning.  Pt states "I feel at least 60%  better since starting PT".   PAIN:  Are you having pain? No   PRECAUTIONS: None  WEIGHT BEARING RESTRICTIONS No  FALLS:  Has patient fallen in last 6 months? No  LIVING ENVIRONMENT: Lives with: lives with their family Lives in: House/apartment   OCCUPATION: retired  PLOF: Independent  PATIENT GOALS to have less prolapse symptoms  PERTINENT HISTORY:  Vaginal hysterectomy, bladder suspension, cystoscopy 2015 Sexual abuse: No  BOWEL MOVEMENT Pain with bowel movement: No Type of bowel movement:Type (Bristol Stool Scale) 4, Frequency daily, and Strain No Fully empty rectum: Yes:   Leakage: No Pads: No Fiber supplement: No  URINATION Pain with urination: No Fully empty bladder: Yes:   Stream: Strong Urgency: Yes: sometimes based on what she drinks, was worse but had a bladder infection Frequency: 3-4 hours Leakage:  very rarely with very strong urgency.  Pads: No  INTERCOURSE Pain with intercourse:  not active Ability to have vaginal penetration:  Yes:   Dryness noted   PREGNANCY Vaginal deliveries 2 Tearing Yes: episiotomy both    C-section deliveries 0 Currently pregnant No  PROLAPSE Rectocele reports "something is there at rectum, not outside but there."  Like a tampon there.    OBJECTIVE:   DIAGNOSTIC FINDINGS:    COGNITION:  Overall cognitive status: Within functional limits for tasks assessed     SENSATION:  Light touch: Appears intact  Proprioception: Appears  intact  MUSCLE LENGTH: Bil hamstrings and adductors limited by 25%                POSTURE: No Significant postural limitations   LUMBARAROM/PROM  WFL  LOWER EXTREMITY ROM:  Bil WFL  LOWER EXTREMITY MMT:  Grossly bil hips 4/5; knees 5/5 ankles 5/5   PALPATION:   General  no TTP                External Perineal Exam no TTP, mild dryness noted                             Internal Pelvic Floor  no TTP  Patient confirms identification and approves PT to assess internal pelvic floor and treatment Yes  PELVIC MMT:   MMT eval  Vaginal 4/5; 9s isometric; 3 reps  Internal Anal Sphincter   External Anal Sphincter   Puborectalis   Diastasis Recti   (Blank rows = not tested)        TONE: WFL  PROLAPSE: Mild posterior wall laxity in hooklying with strong cough  TODAY'S TREATMENT   08/18/22  NMRE: all exercises cued for coordination of breathing and pelvic floor and core activations 2x10 squats 15#  Lunges x10 each Farmer's carry 20# and 8# 1000'  Self-care: pt educated on continued HEP, coordination of pelvic floor and breathing with functional activities for home and community and voiding mechanics.                      PATIENT EDUCATION:  Education details: [id number] Person educated: Patient Education method: Consulting civil engineer, Demonstration, Tactile cues, Verbal cues, and Handouts Education comprehension: verbalized understanding and returned demonstration   HOME EXERCISE PROGRAM: [id number]  ASSESSMENT:  CLINICAL IMPRESSION: Patient session focused on hip and core strengthening exercises to coordination with pelvic floor and breathing mechanics for decreased stain at prolapse. Pt demonstrated improved coordination with more awareness and minimal cues and tolerated increased challenge and progression of exercises today with standing and weighted resistance today. Pt would  benefit from additional PT to further address deficits for improved pressure management as pt  did have held breath prior to cuing.     OBJECTIVE IMPAIRMENTS decreased coordination, decreased endurance, decreased strength, and improper body mechanics.   ACTIVITY LIMITATIONS carrying, lifting, and continence  PARTICIPATION LIMITATIONS: community activity  PERSONAL FACTORS Time since onset of injury/illness/exacerbation and 1 comorbidity: history of hysterectomy   are also affecting patient's functional outcome.   REHAB POTENTIAL: Good  CLINICAL DECISION MAKING: Stable/uncomplicated  EVALUATION COMPLEXITY: Low   GOALS: Goals reviewed with patient? Yes  SHORT TERM GOALS: Target date: 07/05/2022  Pt to be I with HEP.  Baseline: Goal status: MET  2.  Pt will report her BMs and urine voids are complete within good mechanics due to improved voiding habits and evacuation techniques without straining at pelvic floor or prolapse.  Baseline:  Goal status: MET  3.  Pt to demonstrate improved coordination of pelvic floor and breathing mechanics with body weight squat to decrease strain at pelvic floor and prolapse. Baseline:  Goal status: MET   LONG TERM GOALS: Target date:  10/18/2022    Pt to be I with advanced HEP.  Baseline:  Goal status: on going   2.  Pt to demonstrate at least 5/5 bil hip strength for improved pelvic stability and functional squats without leakage.  Baseline:  Goal status: on going  3.  Pt to demonstrate improved coordination of pelvic floor and breathing mechanics with 20# squat to decrease strain at pelvic floor and prolapse. Baseline:  Goal status: on going  4.  Pt to report at least 75% reduction in prolapse symptoms compared to prior to PT for improved QOL.  Baseline:  Goal status: on going   PLAN: PT FREQUENCY:  1x every other week  PT DURATION:  6 sessions  PLANNED INTERVENTIONS: Therapeutic exercises, Therapeutic activity, Neuromuscular re-education, Patient/Family education, Joint mobilization, Dry Needling, Spinal mobilization,  Cryotherapy, Moist heat, scar mobilization, Taping, Biofeedback, and Manual therapy  PLAN FOR NEXT SESSION: coordination of pelvic floor and breathing mechanics with strengthening, voiding and breathing mechanics     Stacy Gardner, PT, DPT 08/18/2310:43 AM

## 2022-08-18 NOTE — Patient Instructions (Signed)
Types of Fiber  There are two main types of fiber:  insoluble and soluble.  Both of these types can prevent and relieve constipation and diarrhea, although some people find one or the other to be more easily digested.  This handout details information about both types of fiber. recommended 25-35 grams of fiber per day,  average 9-12 grams per meal   key is a balance between soluble and insoluble  Insoluble Fiber        Functions of Insoluble Fiber moves bulk through the intestines  controls and balances the pH (acidity) in the intestines   This type of fiber should be avoided or reduced if you have soft, frequent bowel movements or leakage      Benefits of Insoluble Fiber promotes regular bowel movement and prevents constipation  removes fecal waste through colon in less time  keeps an optimal pH in intestines to prevent microbes from producing cancer substances, therefore preventing colon cancer        Food Sources of Insoluble Fiber whole-wheat products  wheat bran "miller's bran" corn bran  flax seed or other seeds vegetables such as green beans, broccoli, cauliflower and potato skins  fruit skins and root vegetable skins  popcorn brown rice  Soluble Fiber( Types 5,6,7)       Functions of Soluble Fiber  holds water in the colon to bulk and soften the stool prolongs stomach emptying time so that sugar is released and absorbed more slowly  prevent leakage associated with soft, frequent bowel movements.        Benefits of Soluble Fiber lowers total cholesterol and LDL cholesterol (the bad cholesterol) therefore reducing the risk of heart disease  regulates blood sugar for people with diabetes       Food Sources of Soluble Fiber oat/oat bran dried beans and peas  nuts  barley  flax seed or other seeds fruits such as oranges, pears, peaches, and apples  vegetables such as carrots  psyllium husk  Prunes

## 2022-08-18 NOTE — Addendum Note (Signed)
Addended by: Junie Panning on: 08/18/2022 02:43 PM   Modules accepted: Orders

## 2022-08-19 DIAGNOSIS — Z0000 Encounter for general adult medical examination without abnormal findings: Secondary | ICD-10-CM | POA: Diagnosis not present

## 2022-08-19 DIAGNOSIS — Z79899 Other long term (current) drug therapy: Secondary | ICD-10-CM | POA: Diagnosis not present

## 2022-08-19 DIAGNOSIS — E039 Hypothyroidism, unspecified: Secondary | ICD-10-CM | POA: Diagnosis not present

## 2022-08-19 DIAGNOSIS — E7800 Pure hypercholesterolemia, unspecified: Secondary | ICD-10-CM | POA: Diagnosis not present

## 2022-08-26 DIAGNOSIS — M6740 Ganglion, unspecified site: Secondary | ICD-10-CM | POA: Diagnosis not present

## 2022-09-01 ENCOUNTER — Encounter: Payer: Medicare HMO | Admitting: Physical Therapy

## 2022-09-19 ENCOUNTER — Ambulatory Visit: Payer: Medicare HMO | Attending: Obstetrics and Gynecology | Admitting: Physical Therapy

## 2022-09-19 DIAGNOSIS — R279 Unspecified lack of coordination: Secondary | ICD-10-CM | POA: Insufficient documentation

## 2022-09-19 DIAGNOSIS — M6281 Muscle weakness (generalized): Secondary | ICD-10-CM | POA: Insufficient documentation

## 2022-09-19 NOTE — Therapy (Signed)
OUTPATIENT PHYSICAL THERAPY FEMALE PELVIC EVALUATION   Patient Name: Crystal Carr MRN: [id number] DOB:[birth year], 71 y.o., female Today's Date: 09/19/2022   PT End of Session - 09/19/22 1002     Visit Number 5    Date for PT Re-Evaluation 10/18/22    Authorization Type aetna medicare    PT Start Time [phone]   pt arrival time   PT Stop Time 1015    PT Time Calculation (min) 38 min    Activity Tolerance Patient tolerated treatment well    Behavior During Therapy WFL for tasks assessed/performed               Past Medical History:  Diagnosis Date   Family history of colon cancer    Family history of pancreatic cancer    Hypothyroidism    Osteopenia    Thyroid disease    Hyperthyroid--had iodine therapy   Urinary incontinence    with sneezing, coughing   Past Surgical History:  Procedure Laterality Date   BLADDER SUSPENSION N/A 07/01/2014   Procedure:  TRANSVAGINAL TAPE (TVT) PROCEDURE,;  Surgeon: Jamey Reas de Berton Lan, MD;  Location: [facility];  Service: Gynecology;  Laterality: N/A;   CATARACT EXTRACTION Bilateral    CYSTOSCOPY N/A 07/01/2014   Procedure: CYSTOSCOPY;  Surgeon: Jamey Reas de Berton Lan, MD;  Location: [facility];  Service: Gynecology;  Laterality: N/A;   THORACIC OUTLET SURGERY     VAGINAL HYSTERECTOMY N/A 07/01/2014   Procedure: HYSTERECTOMY VAGINAL;  Surgeon: Jamey Reas de Berton Lan, MD;  Location: [facility];  Service: Gynecology;  Laterality: N/A;   Patient Active Problem List   Diagnosis Date Noted   Family history of pancreatic cancer    Family history of colon cancer     PCP: Lajean Manes, MD  REFERRING PROVIDER: Nunzio Cobbs, MD  REFERRING DIAG: [phone] (ICD-10-CM) - Pelvic floor dysfunction  THERAPY DIAG:  Muscle weakness (generalized)  Unspecified lack of coordination  Rationale for Evaluation and Treatment Rehabilitation  ONSET DATE: 2016  SUBJECTIVE:                                                                                                                                                                                            SUBJECTIVE STATEMENT: Pt states urination frequency has been more normal now, with urge pt able to do urge and helps a lot. Prolapse symptoms rarely felt now.   PAIN:  Are you having pain? No   PRECAUTIONS: None  WEIGHT BEARING RESTRICTIONS No  FALLS:  Has patient fallen in last 6 months? No  LIVING  ENVIRONMENT: Lives with: lives with their family Lives in: House/apartment   OCCUPATION: retired  PLOF: Independent  PATIENT GOALS to have less prolapse symptoms  PERTINENT HISTORY:  Vaginal hysterectomy, bladder suspension, cystoscopy 2015 Sexual abuse: No  BOWEL MOVEMENT Pain with bowel movement: No Type of bowel movement:Type (Bristol Stool Scale) 4, Frequency daily, and Strain No Fully empty rectum: Yes:   Leakage: No Pads: No Fiber supplement: No  URINATION Pain with urination: No Fully empty bladder: Yes:   Stream: Strong Urgency: Yes: sometimes based on what she drinks, was worse but had a bladder infection Frequency: 3-4 hours Leakage:  very rarely with very strong urgency.  Pads: No  INTERCOURSE Pain with intercourse:  not active Ability to have vaginal penetration:  Yes:   Dryness noted   PREGNANCY Vaginal deliveries 2 Tearing Yes: episiotomy both    C-section deliveries 0 Currently pregnant No  PROLAPSE Rectocele reports "something is there at rectum, not outside but there."  Like a tampon there.    OBJECTIVE:   DIAGNOSTIC FINDINGS:    COGNITION:  Overall cognitive status: Within functional limits for tasks assessed     SENSATION:  Light touch: Appears intact  Proprioception: Appears intact  MUSCLE LENGTH: Bil hamstrings and adductors limited by 25%                POSTURE: No Significant postural limitations   LUMBARAROM/PROM  WFL  LOWER EXTREMITY ROM:  Bil WFL  LOWER  EXTREMITY MMT:  Grossly bil hips 4/5; knees 5/5 ankles 5/5   PALPATION:   General  no TTP                External Perineal Exam no TTP, mild dryness noted                             Internal Pelvic Floor  no TTP  Patient confirms identification and approves PT to assess internal pelvic floor and treatment Yes  PELVIC MMT:   MMT eval  Vaginal 4/5; 9s isometric; 3 reps  Internal Anal Sphincter   External Anal Sphincter   Puborectalis   Diastasis Recti   (Blank rows = not tested)        TONE: WFL  PROLAPSE: Mild posterior wall laxity in hooklying with strong cough  TODAY'S TREATMENT   09/19/2022: Pt declined internal reassessment today as she is rarely having prolapse symptoms anymore.  2x10 squats 20# with pelvic floor coordination and breathing mechanics Lunges 10# x10 each Farmer's carry 20# and 10# for 1000' no prolapse symptoms     PATIENT EDUCATION:  Education details: [id number] Person educated: Patient Education method: Explanation, Demonstration, Tactile cues, Verbal cues, and Handouts Education comprehension: verbalized understanding and returned demonstration   HOME EXERCISE PROGRAM: [email or web address]  ASSESSMENT:  CLINICAL IMPRESSION: Patient session focused on hip and core strengthening with pelvic floor coordination and pt needed no additional cuing for technique. Pt tolerated well and has met all goals. Pt agreeable and comfortable for DC after today's session, understands she will need new PT referral for future PT needs.    OBJECTIVE IMPAIRMENTS decreased coordination, decreased endurance, decreased strength, and improper body mechanics.   ACTIVITY LIMITATIONS carrying, lifting, and continence  PARTICIPATION LIMITATIONS: community activity  PERSONAL FACTORS Time since onset of injury/illness/exacerbation and 1 comorbidity: history of hysterectomy   are also affecting patient's functional outcome.   REHAB POTENTIAL: Good  CLINICAL DECISION MAKING:  Stable/uncomplicated  EVALUATION COMPLEXITY: Low  GOALS: Goals reviewed with patient? Yes  SHORT TERM GOALS: Target date: 07/05/2022  Pt to be I with HEP.  Baseline: Goal status: MET  2.  Pt will report her BMs and urine voids are complete within good mechanics due to improved voiding habits and evacuation techniques without straining at pelvic floor or prolapse.  Baseline:  Goal status: MET  3.  Pt to demonstrate improved coordination of pelvic floor and breathing mechanics with body weight squat to decrease strain at pelvic floor and prolapse. Baseline:  Goal status: MET   LONG TERM GOALS: Target date:  10/18/2022    Pt to be I with advanced HEP.  Baseline:  Goal status: MET  2.  Pt to demonstrate at least 5/5 bil hip strength for improved pelvic stability and functional squats without leakage.  Baseline:  Goal status: MET  3.  Pt to demonstrate improved coordination of pelvic floor and breathing mechanics with 20# squat to decrease strain at pelvic floor and prolapse. Baseline:  Goal status: Met  4.  Pt to report at least 75% reduction in prolapse symptoms compared to prior to PT for improved QOL.  Baseline:  Goal status:MET   PLAN: PT FREQUENCY:  1x every other week  PT DURATION:  6 sessions  PLANNED INTERVENTIONS: Therapeutic exercises, Therapeutic activity, Neuromuscular re-education, Patient/Family education, Joint mobilization, Dry Needling, Spinal mobilization, Cryotherapy, Moist heat, scar mobilization, Taping, Biofeedback, and Manual therapy  PLAN FOR NEXT SESSION: coordination of pelvic floor and breathing mechanics with strengthening, voiding and breathing mechanics     PHYSICAL THERAPY DISCHARGE SUMMARY  Visits from Start of Care: 5  Current functional level related to goals / functional outcomes: All goals met   Remaining deficits: All goals met   Education / Equipment: HEP   Patient agrees to discharge. Patient goals were met. Patient is  being discharged due to meeting the stated rehab goals.   Stacy Gardner, PT, DPT 09/19/2309:16 AM

## 2022-09-20 DIAGNOSIS — M5451 Vertebrogenic low back pain: Secondary | ICD-10-CM | POA: Diagnosis not present

## 2022-09-26 ENCOUNTER — Encounter: Payer: Self-pay | Admitting: Obstetrics and Gynecology

## 2022-09-26 ENCOUNTER — Other Ambulatory Visit: Payer: Self-pay

## 2022-09-26 MED ORDER — ESTRADIOL 0.1 MG/GM VA CREA: TOPICAL_CREAM | VAGINAL | 0 refills | Status: DC

## 2022-09-26 NOTE — Telephone Encounter (Signed)
Request for refill on Estradiol cream.  AEX 5.31.23

## 2022-09-27 ENCOUNTER — Other Ambulatory Visit: Payer: Self-pay

## 2022-09-27 DIAGNOSIS — M5451 Vertebrogenic low back pain: Secondary | ICD-10-CM | POA: Diagnosis not present

## 2022-10-04 DIAGNOSIS — M5451 Vertebrogenic low back pain: Secondary | ICD-10-CM | POA: Diagnosis not present

## 2022-10-11 DIAGNOSIS — M5451 Vertebrogenic low back pain: Secondary | ICD-10-CM | POA: Diagnosis not present

## 2022-12-21 DIAGNOSIS — M5431 Sciatica, right side: Secondary | ICD-10-CM | POA: Diagnosis not present

## 2022-12-21 DIAGNOSIS — M7582 Other shoulder lesions, left shoulder: Secondary | ICD-10-CM | POA: Diagnosis not present

## 2023-01-05 DIAGNOSIS — M25551 Pain in right hip: Secondary | ICD-10-CM | POA: Diagnosis not present

## 2023-01-05 DIAGNOSIS — M5431 Sciatica, right side: Secondary | ICD-10-CM | POA: Diagnosis not present

## 2023-01-12 ENCOUNTER — Other Ambulatory Visit (HOSPITAL_BASED_OUTPATIENT_CLINIC_OR_DEPARTMENT_OTHER): Payer: Self-pay

## 2023-01-12 ENCOUNTER — Encounter: Payer: Self-pay | Admitting: Obstetrics and Gynecology

## 2023-01-12 DIAGNOSIS — N952 Postmenopausal atrophic vaginitis: Secondary | ICD-10-CM

## 2023-01-13 DIAGNOSIS — M25551 Pain in right hip: Secondary | ICD-10-CM | POA: Diagnosis not present

## 2023-01-13 DIAGNOSIS — M5431 Sciatica, right side: Secondary | ICD-10-CM | POA: Diagnosis not present

## 2023-01-13 NOTE — Telephone Encounter (Signed)
Last AEX 05/11/2022--recall placed for 2024.  Last mammo 06/24/2022- neg birad s1  Last rx sent 09/26/2022 for 1 (42.5g tube)  Rx pend.

## 2023-01-14 MED ORDER — ESTRADIOL 0.1 MG/GM VA CREA: TOPICAL_CREAM | VAGINAL | 0 refills | Status: DC

## 2023-01-18 DIAGNOSIS — M5431 Sciatica, right side: Secondary | ICD-10-CM | POA: Diagnosis not present

## 2023-01-18 DIAGNOSIS — M25551 Pain in right hip: Secondary | ICD-10-CM | POA: Diagnosis not present

## 2023-01-26 DIAGNOSIS — M5431 Sciatica, right side: Secondary | ICD-10-CM | POA: Diagnosis not present

## 2023-01-26 DIAGNOSIS — M25551 Pain in right hip: Secondary | ICD-10-CM | POA: Diagnosis not present

## 2023-02-14 DIAGNOSIS — M5431 Sciatica, right side: Secondary | ICD-10-CM | POA: Diagnosis not present

## 2023-02-14 DIAGNOSIS — M25551 Pain in right hip: Secondary | ICD-10-CM | POA: Diagnosis not present

## 2023-03-05 DIAGNOSIS — N3001 Acute cystitis with hematuria: Secondary | ICD-10-CM | POA: Diagnosis not present

## 2023-03-05 DIAGNOSIS — Z6821 Body mass index (BMI) 21.0-21.9, adult: Secondary | ICD-10-CM | POA: Diagnosis not present

## 2023-03-05 DIAGNOSIS — E039 Hypothyroidism, unspecified: Secondary | ICD-10-CM | POA: Diagnosis not present

## 2023-03-15 NOTE — Progress Notes (Deleted)
GYNECOLOGY  VISIT   HPI: 72 y.o.   Divorced  Caucasian  female   G2P2002 with Patient's last menstrual period was 02/09/2002.   here for   rectocele  GYNECOLOGIC HISTORY: Patient's last menstrual period was 02/09/2002. Contraception:  PMP Menopausal hormone therapy:  estrace Last mammogram:  06/24/22 Breast Density Category C, BI-RADS CAT 1 NEG Last pap smear:   11/19/14 neg        OB History     Gravida  2   Para  2   Term  2   Preterm      AB      Living  2      SAB      IAB      Ectopic      Multiple      Live Births                 Patient Active Problem List   Diagnosis Date Noted   Family history of pancreatic cancer    Family history of colon cancer     Past Medical History:  Diagnosis Date   Family history of colon cancer    Family history of pancreatic cancer    Hypothyroidism    Osteopenia    Thyroid disease    Hyperthyroid--had iodine therapy   Urinary incontinence    with sneezing, coughing    Past Surgical History:  Procedure Laterality Date   BLADDER SUSPENSION N/A 07/01/2014   Procedure:  TRANSVAGINAL TAPE (TVT) PROCEDURE,;  Surgeon: Jamey Reas de Berton Lan, MD;  Location: [facility];  Service: Gynecology;  Laterality: N/A;   CATARACT EXTRACTION Bilateral    CYSTOSCOPY N/A 07/01/2014   Procedure: CYSTOSCOPY;  Surgeon: Jamey Reas de Berton Lan, MD;  Location: [facility];  Service: Gynecology;  Laterality: N/A;   THORACIC OUTLET SURGERY     VAGINAL HYSTERECTOMY N/A 07/01/2014   Procedure: HYSTERECTOMY VAGINAL;  Surgeon: Jamey Reas de Berton Lan, MD;  Location: [facility];  Service: Gynecology;  Laterality: N/A;    Current Outpatient Medications  Medication Sig Dispense Refill   Calcium Citrate-Vitamin D ([employer] CALCIUM CITRATE + D PO) Take 600 mg by mouth 2 (two) times daily.     Cetirizine HCl (ZYRTEC ALLERGY PO) Take by mouth as needed.     Cholecalciferol (VITAMIN D-3) 1000 UNITS CAPS Take 1,000 Units by  mouth daily.      diclofenac Sodium (VOLTAREN) 1 % GEL apply left thumb     estradiol (ESTRACE) 0.1 MG/GM vaginal cream Use 1/2 g vaginally two or three times per week as needed to maintain symptom relief. 42.5 g 0   Fish Oil-Cholecalciferol (FISH OIL + D3) 1000-1000 MG-UNIT CAPS Take 1,000 mg by mouth daily.     ibandronate (BONIVA) 150 MG tablet Take 150 mg by mouth every 30 (thirty) days.     levothyroxine (SYNTHROID, LEVOTHROID) 25 MCG tablet Take 25 mcg by mouth daily.     Multiple Vitamin (MULTIVITAMIN WITH MINERALS) TABS tablet Take 1 tablet by mouth daily.     sulfamethoxazole-trimethoprim (BACTRIM DS) 800-160 MG tablet Take 1 tablet by mouth 2 (two) times daily. 6 tablet 0   tacrolimus (PROTOPIC) 0.1 % ointment Apply topically 2 (two) times daily.     No current facility-administered medications for this visit.     ALLERGIES: Almond oil, Cefzil [cefprozil], Fig extract [ficus], and Peanuts [peanut oil]  Family History  Problem Relation Age of Onset   Pancreatic  cancer Mother 31   Cancer Maternal Aunt 95       large intestine   Testicular cancer Maternal Uncle    Other Maternal Grandfather 35       intestien issues- unk if cancer   Cancer Maternal Uncle        leg- unk if bone or soft tissue?   Cancer Cousin        knee- bone cancer?    Social History   Socioeconomic History   Marital status: Divorced    Spouse name: Not on file   Number of children: Not on file   Years of education: Not on file   Highest education level: Not on file  Occupational History   Not on file  Tobacco Use   Smoking status: Never   Smokeless tobacco: Never  Vaping Use   Vaping Use: Never used  Substance and Sexual Activity   Alcohol use: Yes    Alcohol/week: 2.0 standard drinks of alcohol    Types: 2 Standard drinks or equivalent per week    Comment: 1-2 drinks per month   Drug use: No   Sexual activity: Not Currently    Partners: Male    Birth control/protection: Post-menopausal,  Surgical    Comment: TVH, first intercourse >57 y.o.  Other Topics Concern   Not on file  Social History Narrative   Not on file   Social Determinants of Health   Financial Resource Strain: Not on file  Food Insecurity: Not on file  Transportation Needs: Not on file  Physical Activity: Not on file  Stress: Not on file  Social Connections: Not on file  Intimate Partner Violence: Not on file    Review of Systems  PHYSICAL EXAMINATION:    LMP 02/09/2002     General appearance: alert, cooperative and appears stated age Head: Normocephalic, without obvious abnormality, atraumatic Neck: no adenopathy, supple, symmetrical, trachea midline and thyroid normal to inspection and palpation Lungs: clear to auscultation bilaterally Breasts: normal appearance, no masses or tenderness, No nipple retraction or dimpling, No nipple discharge or bleeding, No axillary or supraclavicular adenopathy Heart: regular rate and rhythm Abdomen: soft, non-tender, no masses,  no organomegaly Extremities: extremities normal, atraumatic, no cyanosis or edema Skin: Skin color, texture, turgor normal. No rashes or lesions Lymph nodes: Cervical, supraclavicular, and axillary nodes normal. No abnormal inguinal nodes palpated Neurologic: Grossly normal  Pelvic: External genitalia:  no lesions              Urethra:  normal appearing urethra with no masses, tenderness or lesions              Bartholins and Skenes: normal                 Vagina: normal appearing vagina with normal color and discharge, no lesions              Cervix: no lesions                Bimanual Exam:  Uterus:  normal size, contour, position, consistency, mobility, non-tender              Adnexa: no mass, fullness, tenderness              Rectal exam: {yes no:[id number]}.  Confirms.              Anus:  normal sphincter tone, no lesions  Chaperone was present for exam:  ***  ASSESSMENT     PLAN  An After Visit Summary was printed  and given to the patient.  ______ minutes face to face time of which over 50% was spent in counseling.

## 2023-03-29 ENCOUNTER — Ambulatory Visit: Payer: Medicare HMO | Admitting: Obstetrics and Gynecology

## 2023-05-03 ENCOUNTER — Other Ambulatory Visit: Payer: Self-pay | Admitting: Obstetrics and Gynecology

## 2023-05-03 DIAGNOSIS — N952 Postmenopausal atrophic vaginitis: Secondary | ICD-10-CM

## 2023-05-04 NOTE — Telephone Encounter (Signed)
Med refill request: Estrace Last AEX: 05/11/22 Next AEX: 05/25/23 Last MMG (if hormonal med) 06/24/22 Breast Density Cat C, BI-RADS CAT 1 neg Refill authorized: Please Advise?

## 2023-05-11 NOTE — Progress Notes (Signed)
72 y.o. G49P2002 Divorced Caucasian female here for breast and pelvic exam.   She is followed for vaginal atrophy and uses vaginal estrogen cream.  Wants to continue.  It provides comfort.  Noticing rectal protrusion.  Not sure if it is interfering with her bowel function. Food choices affect her functioning.  BMs twice a day. Feels uncomfortable.  Gets sore from wiping.  No accidental leakage of stool.  No rectal bleeding.   Some urinary urgency.  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  Not getting up at night.  No leakage of urine with cough or sneeze.   She has done pelvic floor therapy, which was helpful.   PCP:   Dr. Margaretann Loveless   Patient's last menstrual period was 02/09/2002.           Sexually active: No.  The current method of family planning is hysterectomy.    Exercising: Yes.     Cardio, hiking, dancing Smoker:  no  Health Maintenance: Pap:  11/19/14 ASCUS:Neg HR HPV.  Final pathology for uterus/cervix 2015 - no dysplasia or atypia.  History of abnormal Pap:  yes, 2008 hx of dysplasia with colposcopy but no treatment to cervix  MMG:  06/24/22 Breast Density Cat C, BI-RADS CAT 1 neg.  She will schedule. Colonoscopy:  2019 polyps.  Due this year.  BMD:   10/09/20  Result  osteoporotic.  PCP following.  On Boniva. TDaP:  05/01/19 Gardasil:   no HIV: n/a Hep C: n/a Screening Labs: PCP   reports that she has never smoked. She has never used smokeless tobacco. She reports current alcohol use of about 2.0 standard drinks of alcohol per week. She reports that she does not use drugs.  Past Medical History:  Diagnosis Date   Family history of colon cancer    Family history of pancreatic cancer    Hypothyroidism    Osteopenia    Thyroid disease    Hyperthyroid--had iodine therapy   Urinary incontinence    with sneezing, coughing    Past Surgical History:  Procedure Laterality Date   BLADDER SUSPENSION N/A 07/01/2014   Procedure:  TRANSVAGINAL  TAPE (TVT) PROCEDURE,;  Surgeon: Jacqualin Combes de Gwenevere Ghazi, MD;  Location: [facility];  Service: Gynecology;  Laterality: N/A;   CATARACT EXTRACTION Bilateral    CYSTOSCOPY N/A 07/01/2014   Procedure: CYSTOSCOPY;  Surgeon: Jacqualin Combes de Gwenevere Ghazi, MD;  Location: [facility];  Service: Gynecology;  Laterality: N/A;   THORACIC OUTLET SURGERY     VAGINAL HYSTERECTOMY N/A 07/01/2014   Procedure: HYSTERECTOMY VAGINAL;  Surgeon: Jacqualin Combes de Gwenevere Ghazi, MD;  Location: [facility];  Service: Gynecology;  Laterality: N/A;    Current Outpatient Medications  Medication Sig Dispense Refill   Calcium Citrate-Vitamin D (CVS CALCIUM CITRATE + D PO) Take 600 mg by mouth 2 (two) times daily.     Cetirizine HCl (ZYRTEC ALLERGY PO) Take by mouth as needed.     Cholecalciferol (VITAMIN D-3) 1000 UNITS CAPS Take 1,000 Units by mouth daily.      diclofenac Sodium (VOLTAREN) 1 % GEL apply left thumb     estradiol (ESTRACE) 0.1 MG/GM vaginal cream USE 1/2 G VAGINALLY TWO OR THREE TIMES PER WEEK AS NEEDED TO MAINTAIN SYMPTOM RELIEF. 42.5 g 0   Fish Oil-Cholecalciferol (FISH OIL + D3) 1000-1000 MG-UNIT CAPS Take 1,000 mg by mouth daily.     ibandronate (BONIVA) 150 MG tablet Take 150  mg by mouth every 30 (thirty) days.     levothyroxine (SYNTHROID, LEVOTHROID) 25 MCG tablet Take 25 mcg by mouth daily.     Multiple Vitamin (MULTIVITAMIN WITH MINERALS) TABS tablet Take 1 tablet by mouth daily.     tacrolimus (PROTOPIC) 0.1 % ointment Apply topically 2 (two) times daily.     No current facility-administered medications for this visit.    Family History  Problem Relation Age of Onset   Pancreatic cancer Mother 77   Cancer Maternal Aunt 95       large intestine   Testicular cancer Maternal Uncle    Other Maternal Grandfather 34       intestien issues- unk if cancer   Cancer Maternal Uncle        leg- unk if bone or soft tissue?   Cancer Cousin        knee- bone cancer?    Review of  Systems  All other systems reviewed and are negative.   Exam:   BP 116/72 (BP Location: Left Arm, Patient Position: Sitting, Cuff Size: Normal)   Pulse 73   Ht 5\' 3"  (1.6 m)   Wt 113 lb (51.3 kg)   LMP 02/09/2002   SpO2 97%   BMI 20.02 kg/m     General appearance: alert, cooperative and appears stated age Head: normocephalic, without obvious abnormality, atraumatic Neck: no adenopathy, supple, symmetrical, trachea midline and thyroid normal to inspection and palpation Lungs: clear to auscultation bilaterally Breasts: normal appearance, no masses or tenderness, No nipple retraction or dimpling, No nipple discharge or bleeding, No axillary adenopathy Heart: regular rate and rhythm Abdomen: soft, non-tender; no masses, no organomegaly Extremities: extremities normal, atraumatic, no cyanosis or edema Skin: skin color, texture, turgor normal. No rashes or lesions Lymph nodes: cervical, supraclavicular, and axillary nodes normal. Neurologic: grossly normal  Pelvic: External genitalia:  no lesions              No abnormal inguinal nodes palpated.              Urethra:  normal appearing urethra with no masses, tenderness or lesions              Bartholins and Skenes: normal                 Vagina: normal appearing vagina with normal color and discharge, no lesions.  Good support.               Cervix: absent              Pap taken: no Bimanual Exam:  Uterus:  absent              Adnexa: no mass, fullness, tenderness              Rectal exam: Yes.  .  Confirms.              Anus:  normal sphincter tone, small hemorrhoid noted.  Chaperone was present for exam:  Warren Lacy, CMA  Assessment:   Well woman visit with gynecologic exam. Status post TVH/Anterior and Posterior Colporrhaphy, TVT/cysto.  Ovaries remain.  Excellent support. No evidence of pelvic organ prolapse.  Small external hemorrhoid. Osteoporosis.   On Boniva.   Vaginal atrophy.  Encounter for medication monitoring.  FH  pancreatic, colon cancer.  Had genetic counseling but not testing.  Encounter for health education/counseling.   Plan: Mammogram screening discussed. Self breast awareness reviewed. Pap and HR HPV as above. Guidelines  for Calcium, Vitamin D, regular exercise program including cardiovascular and weight bearing exercise. Refill of vaginal estrogen cream.  We discussed the potential effect on breast cancer.  We defined cystocele, rectocele, rectal prolapse, and hemorrhoids today. We discussed genetic counseling and possible testing. We discussed benefits of at least doing counseling.  She accepts a referral for genetic counseling.  Follow up annually and prn.   30 min  total time was spent for this patient encounter, including preparation, face-to-face counseling with the patient, coordination of care, and documentation of the encounter in addition to doing the breast and pelvic exam.

## 2023-05-25 ENCOUNTER — Ambulatory Visit (INDEPENDENT_AMBULATORY_CARE_PROVIDER_SITE_OTHER): Payer: PPO | Admitting: Obstetrics and Gynecology

## 2023-05-25 ENCOUNTER — Encounter: Payer: Self-pay | Admitting: Obstetrics and Gynecology

## 2023-05-25 VITALS — BP 116/72 | HR 73 | Ht 63.0 in | Wt 113.0 lb

## 2023-05-25 DIAGNOSIS — K644 Residual hemorrhoidal skin tags: Secondary | ICD-10-CM | POA: Diagnosis not present

## 2023-05-25 DIAGNOSIS — Z01419 Encounter for gynecological examination (general) (routine) without abnormal findings: Secondary | ICD-10-CM

## 2023-05-25 DIAGNOSIS — Z5181 Encounter for therapeutic drug level monitoring: Secondary | ICD-10-CM

## 2023-05-25 DIAGNOSIS — Z719 Counseling, unspecified: Secondary | ICD-10-CM | POA: Diagnosis not present

## 2023-05-25 DIAGNOSIS — N952 Postmenopausal atrophic vaginitis: Secondary | ICD-10-CM

## 2023-05-25 DIAGNOSIS — Z800 Family history of malignant neoplasm of digestive organs: Secondary | ICD-10-CM

## 2023-05-25 MED ORDER — ESTRADIOL 0.1 MG/GM VA CREA: TOPICAL_CREAM | VAGINAL | 1 refills | Status: DC

## 2023-05-25 NOTE — Patient Instructions (Signed)

## 2023-05-26 ENCOUNTER — Telehealth: Payer: Self-pay | Admitting: Geriatric Medicine

## 2023-05-26 NOTE — Telephone Encounter (Signed)
Called patient twice, left a message in regards to [employer], and if they wanted to schedule in the future. Also left callback number.

## 2023-05-29 ENCOUNTER — Other Ambulatory Visit: Payer: Self-pay | Admitting: Obstetrics and Gynecology

## 2023-05-29 DIAGNOSIS — N952 Postmenopausal atrophic vaginitis: Secondary | ICD-10-CM

## 2023-06-01 DIAGNOSIS — M810 Age-related osteoporosis without current pathological fracture: Secondary | ICD-10-CM | POA: Diagnosis not present

## 2023-06-01 DIAGNOSIS — L309 Dermatitis, unspecified: Secondary | ICD-10-CM | POA: Diagnosis not present

## 2023-06-01 DIAGNOSIS — E039 Hypothyroidism, unspecified: Secondary | ICD-10-CM | POA: Diagnosis not present

## 2023-06-05 ENCOUNTER — Other Ambulatory Visit: Payer: Self-pay | Admitting: Obstetrics and Gynecology

## 2023-06-05 DIAGNOSIS — Z1231 Encounter for screening mammogram for malignant neoplasm of breast: Secondary | ICD-10-CM

## 2023-06-29 ENCOUNTER — Ambulatory Visit
Admission: RE | Admit: 2023-06-29 | Discharge: 2023-06-29 | Disposition: A | Discharge status: Home / Self Care | Payer: PPO | Source: Ambulatory Visit | Attending: Obstetrics and Gynecology | Admitting: Obstetrics and Gynecology

## 2023-06-29 DIAGNOSIS — Z1231 Encounter for screening mammogram for malignant neoplasm of breast: Secondary | ICD-10-CM

## 2023-07-06 ENCOUNTER — Other Ambulatory Visit: Payer: Self-pay

## 2023-07-06 ENCOUNTER — Encounter: Payer: Self-pay | Admitting: Genetic Counselor

## 2023-07-11 ENCOUNTER — Inpatient Hospital Stay: Payer: PPO | Attending: Genetic Counselor | Admitting: Genetic Counselor

## 2023-07-11 ENCOUNTER — Other Ambulatory Visit: Payer: Self-pay

## 2023-07-11 ENCOUNTER — Inpatient Hospital Stay: Payer: PPO

## 2023-07-11 DIAGNOSIS — Z800 Family history of malignant neoplasm of digestive organs: Secondary | ICD-10-CM

## 2023-07-11 NOTE — Progress Notes (Signed)
REFERRING PROVIDER: Steve Rattler, Debbe Bales, MD  PRIMARY PROVIDER:  Thana Ates, MD  PRIMARY REASON FOR VISIT:  1. Family history of pancreatic cancer   2. Family history of colon cancer    HISTORY OF PRESENT ILLNESS:   Crystal Carr, a 72 y.o. female, was seen for a [place] cancer genetics consultation at the request of Dr. Ricki Miller due to a family history of cancer.  Crystal Carr presents to clinic today to discuss the possibility of a hereditary predisposition to cancer, to discuss genetic testing, and to further clarify her future cancer risks, as well as potential cancer risks for family members.   Crystal Carr is a 72 y.o. female with no personal history of cancer.    RISK FACTORS:  Menarche was at age 50.  First live birth at age 35.  OCP use for approximately 7 years.  Ovaries intact: yes.  Uterus intact: no.  Menopausal status: postmenopausal.  HRT use: 0 years. Colonoscopy: yes;  reports 1 benign polyp in 2019 . Mammogram within the last year: yes. Number of breast biopsies: 0. Any excessive radiation exposure in the past: no  Past Medical History:  Diagnosis Date   Family history of colon cancer    Family history of pancreatic cancer    Hypothyroidism    Osteopenia    Thyroid disease    Hyperthyroid--had iodine therapy   Urinary incontinence    with sneezing, coughing    Past Surgical History:  Procedure Laterality Date   BLADDER SUSPENSION N/A 07/01/2014   Procedure:  TRANSVAGINAL TAPE (TVT) PROCEDURE,;  Surgeon: Jacqualin Combes de Gwenevere Ghazi, MD;  Location: [facility];  Service: Gynecology;  Laterality: N/A;   CATARACT EXTRACTION Bilateral    CYSTOSCOPY N/A 07/01/2014   Procedure: CYSTOSCOPY;  Surgeon: Jacqualin Combes de Gwenevere Ghazi, MD;  Location: [facility];  Service: Gynecology;  Laterality: N/A;   THORACIC OUTLET SURGERY     VAGINAL HYSTERECTOMY N/A 07/01/2014   Procedure: HYSTERECTOMY VAGINAL;  Surgeon: Jacqualin Combes de Gwenevere Ghazi, MD;  Location: [facility];  Service: Gynecology;  Laterality: N/A;    Social History   Socioeconomic History   Marital status: Divorced    Spouse name: Not on file   Number of children: Not on file   Years of education: Not on file   Highest education level: Not on file  Occupational History   Not on file  Tobacco Use   Smoking status: Never   Smokeless tobacco: Never  Vaping Use   Vaping status: Never Used  Substance and Sexual Activity   Alcohol use: Yes    Alcohol/week: 2.0 standard drinks of alcohol    Types: 2 Standard drinks or equivalent per week    Comment: 1-2 drinks per month   Drug use: No   Sexual activity: Not Currently    Partners: Male    Birth control/protection: Post-menopausal, Surgical    Comment: TVH, first intercourse >62 y.o.  Other Topics Concern   Not on file  Social History Narrative   Not on file   Social Determinants of Health   Financial Resource Strain: Not on file  Food Insecurity: Not on file  Transportation Needs: Not on file  Physical Activity: Not on file  Stress: Not on file  Social Connections: Not on file     FAMILY HISTORY:  We obtained a detailed, 4-generation family history.  Significant diagnoses are listed below: Family History  Problem Relation Age of Onset  Pancreatic cancer Mother 67   Cancer Maternal Aunt 95       large intestine   Testicular cancer Maternal Uncle    Other Maternal Grandfather 36       intestien issues- unk if cancer   Cancer Maternal Uncle        leg- unk if bone or soft tissue?   Cancer Cousin        knee- bone cancer?     Crystal Carr father: died at 77, no history of cancer.  Paternal Aunts/Uncles: 6 paternal aunts/uncles, no history of cancer.  Paternal cousins: 1 paternal cousin died of cancer in the knee-  unknown if bone or soft tissue Paternal grandfather: died due to trauma Paternal grandmother: no history of cancer.   Crystal Carr mother: died of pancreatic cancer dx  at 64 Maternal Aunts/Uncles: 57 maternal aunts/uncles, 1 maternal aunt dx with colon cancer in her 15's,  1 maternal uncle dx with testicular cancer in his 76s, 1 maternal uncle dx with cancer in the leg- unknown if bone or soft tissue Maternal cousins: no history of cancer Maternal grandfather: died in his 56's- intestinal issues, unknown if cancer Maternal grandmother: died in her 34's with no history of cancer.   Crystal Carr is unaware of previous family history of genetic testing for hereditary cancer risks. Patient's maternal ancestors are of [place] descent, and paternal ancestors are of [place] descent. There is no reported Ashkenazi Jewish ancestry. There is no known consanguinity.  GENETIC COUNSELING ASSESSMENT: Crystal Carr is a 72 y.o. female with a family history of cancer which is somewhat suggestive of a hereditary predisposition to cancer given her mother's history of pancreatic cancer. We, therefore, discussed and recommended the following at today's visit.   DISCUSSION: We discussed that 5 - 10% of cancer is hereditary, but 20% of pancreatic cancer is hereditary. There are many genes that can be associated with hereditary pancreatic cancer syndromes.  We discussed that testing is beneficial for several reasons, including knowing about other cancer risks, identifying potential screening and risk-reduction options that may be appropriate, and to understanding if other family members could be at risk for cancer and allowing them to undergo genetic testing.  We reviewed the characteristics, features and inheritance patterns of hereditary cancer syndromes. We also discussed genetic testing, including the appropriate family members to test, the process of testing, insurance coverage and turn-around-time for results. We discussed the implications of a negative, positive, carrier and/or variant of uncertain significant result. We recommended Crystal Carr pursue genetic testing for a panel  that contains genes associated with pancreatic and colon cancer.  Crystal Carr was offered a common hereditary cancer panel (34 genes) and an expanded pan-cancer panel (71 genes). Crystal Carr was informed of the benefits and limitations of each panel, including that expanded pan-cancer panels contain several genes that do not have clear management guidelines at this point in time.  We also discussed that as the number of genes included on a panel increases, the chances of variants of uncertain significance increases.  Based on Crystal Carr's family history of cancer, she meets medical criteria for genetic testing. Despite that she meets criteria, she may still have an out of pocket cost. We discussed that if her out of pocket cost for testing is over $100, the laboratory will call and confirm whether she wants to proceed with testing.  If the out of pocket cost of testing is less than $100 she will be billed by the genetic testing  laboratory.   We discussed that some people do not want to undergo genetic testing due to fear of genetic discrimination.  A federal law called the Genetic Information Non-Discrimination Act (GINA) of 2008 helps protect individuals against genetic discrimination based on their genetic test results.  It impacts both health insurance and employment.  With health insurance, it protects against increased premiums, being kicked off insurance or being forced to take a test in order to be insured.  For employment it protects against hiring, firing and promoting decisions based on genetic test results.  GINA does not apply to those in the [employer], those who work for companies with less than 15 employees, and new life insurance or long-term disability insurance policies.  Health status due to a cancer diagnosis is not protected under GINA.  PLAN: Despite our recommendation, Crystal Carr did not wish to pursue genetic testing at today's visit. She would like to think more about  testing before proceeding and will contact us when she is ready. We understand this decision and remain available to coordinate genetic testing at any time in the future. We, therefore, recommend Crystal Carr continue to follow the cancer screening guidelines given by her primary healthcare provider.  Crystal Carr questions were answered to her satisfaction today. Our contact information was provided should additional questions or concerns arise. Thank you for the referral and allowing [place] to share in the care of your patient.   Lalla Brothers, MS, [facility] Genetic Counselor [email or web address] (P) [phone]  The patient was seen for a total of 20 minutes in face-to-face genetic counseling. The patient was seen alone.  Drs. Pamelia Hoit and/or Mosetta Putt were available to discuss this case as needed.  _______________________________________________________________________ For Office Staff:  Number of people involved in session: 1 Was an Intern/ student involved with case: no

## 2023-08-08 DIAGNOSIS — K635 Polyp of colon: Secondary | ICD-10-CM | POA: Diagnosis not present

## 2023-08-08 DIAGNOSIS — Z09 Encounter for follow-up examination after completed treatment for conditions other than malignant neoplasm: Secondary | ICD-10-CM | POA: Diagnosis not present

## 2023-08-08 DIAGNOSIS — Z86010 Personal history of colonic polyps: Secondary | ICD-10-CM | POA: Diagnosis not present

## 2023-08-08 DIAGNOSIS — K648 Other hemorrhoids: Secondary | ICD-10-CM | POA: Diagnosis not present

## 2023-08-08 DIAGNOSIS — K5730 Diverticulosis of large intestine without perforation or abscess without bleeding: Secondary | ICD-10-CM | POA: Diagnosis not present

## 2023-08-26 DIAGNOSIS — Z6821 Body mass index (BMI) 21.0-21.9, adult: Secondary | ICD-10-CM | POA: Diagnosis not present

## 2023-08-26 DIAGNOSIS — N390 Urinary tract infection, site not specified: Secondary | ICD-10-CM | POA: Diagnosis not present

## 2023-08-29 DIAGNOSIS — M810 Age-related osteoporosis without current pathological fracture: Secondary | ICD-10-CM | POA: Diagnosis not present

## 2023-08-29 DIAGNOSIS — Z79899 Other long term (current) drug therapy: Secondary | ICD-10-CM | POA: Diagnosis not present

## 2023-08-29 DIAGNOSIS — E039 Hypothyroidism, unspecified: Secondary | ICD-10-CM | POA: Diagnosis not present

## 2023-08-29 DIAGNOSIS — K623 Rectal prolapse: Secondary | ICD-10-CM | POA: Diagnosis not present

## 2023-08-29 DIAGNOSIS — E7800 Pure hypercholesterolemia, unspecified: Secondary | ICD-10-CM | POA: Diagnosis not present

## 2023-08-29 DIAGNOSIS — Z0000 Encounter for general adult medical examination without abnormal findings: Secondary | ICD-10-CM | POA: Diagnosis not present

## 2023-08-31 ENCOUNTER — Telehealth: Payer: Self-pay | Admitting: Genetic Counselor

## 2023-08-31 ENCOUNTER — Other Ambulatory Visit: Payer: Self-pay | Admitting: Genetic Counselor

## 2023-08-31 DIAGNOSIS — Z1379 Encounter for other screening for genetic and chromosomal anomalies: Secondary | ICD-10-CM

## 2023-08-31 NOTE — Telephone Encounter (Signed)
Ms. Spirk contacted our office to let us know that she would like to proceed with genetic testing. We have scheduled her for a blood draw for [facility]-Expanded Panel on 09/04/2023.   Lalla Brothers, MS, [facility] Genetic Counselor [email or web address] (P) [phone]

## 2023-09-04 ENCOUNTER — Inpatient Hospital Stay: Payer: PPO | Attending: Genetic Counselor

## 2023-09-04 DIAGNOSIS — Z1379 Encounter for other screening for genetic and chromosomal anomalies: Secondary | ICD-10-CM

## 2023-09-04 DIAGNOSIS — Z800 Family history of malignant neoplasm of digestive organs: Secondary | ICD-10-CM | POA: Diagnosis not present

## 2023-09-04 LAB — GENETIC SCREENING ORDER

## 2023-09-22 ENCOUNTER — Encounter: Payer: Self-pay | Admitting: Genetic Counselor

## 2023-09-22 ENCOUNTER — Telehealth: Payer: Self-pay | Admitting: Genetic Counselor

## 2023-09-22 DIAGNOSIS — Z1379 Encounter for other screening for genetic and chromosomal anomalies: Secondary | ICD-10-CM | POA: Insufficient documentation

## 2023-09-22 NOTE — Telephone Encounter (Addendum)
I attempted to contact Crystal Carr to discuss her genetic testing results (71 genes). I left a voicemail requesting she call me back at [phone].  Lalla Brothers, MS, [facility] Genetic Counselor [email or web address] (P) [phone]

## 2023-09-24 ENCOUNTER — Ambulatory Visit
Admission: EM | Admit: 2023-09-24 | Discharge: 2023-09-24 | Disposition: A | Discharge status: Home / Self Care | Payer: PPO | Attending: Family Medicine | Admitting: Family Medicine

## 2023-09-24 ENCOUNTER — Other Ambulatory Visit: Payer: Self-pay

## 2023-09-24 DIAGNOSIS — N390 Urinary tract infection, site not specified: Secondary | ICD-10-CM | POA: Diagnosis not present

## 2023-09-24 LAB — POCT URINALYSIS DIP (MANUAL ENTRY)
Bilirubin, UA: NEGATIVE
Glucose, UA: NEGATIVE mg/dL
Ketones, POC UA: NEGATIVE mg/dL
Nitrite, UA: NEGATIVE
Protein Ur, POC: NEGATIVE mg/dL
Spec Grav, UA: <=1.005 — AB (ref 1.010–1.025)
Urobilinogen, UA: 0.2 U/dL
pH, UA: 5.5 (ref 5.0–8.0)

## 2023-09-24 MED ORDER — SULFAMETHOXAZOLE-TRIMETHOPRIM 800-160 MG PO TABS: 1.0000 | ORAL_TABLET | Freq: Two times a day (BID) | ORAL | 0 refills | Status: AC

## 2023-09-24 NOTE — ED Triage Notes (Addendum)
X 2 days has had urinary pain, frequency. Also getting up during the night to urinate. Treated with nitrofurantoin a month ago, does not feel like infection fully went away.

## 2023-09-24 NOTE — Discharge Instructions (Signed)
Continue to drink lots of water Take the antibiotic 2 times a day Your urine sample has been sent to the laboratory.  You will be called if any change in antibiotic is indicated.  You can see your test results on MyChart Follow-up with your family doctor

## 2023-09-24 NOTE — ED Provider Notes (Signed)
[facility]    CSN: [id number] Arrival date & time: 09/24/23  1512      History   Chief Complaint Chief Complaint  Patient presents with   Urinary Frequency    HPI Crystal Carr is a 72 y.o. female.   Patient has urinary and dysuria.  This has been going on for 2 days.  When she empties her bladder she has to go back another 20 minutes.  No hematuria.  She has pain at the end of urination.  No abdominal pain.  No flank pain.  No nausea or vomiting.  No fever or chills.  No history of kidney stones or kidney infection.  States she has had 3 or so infections since menopause,  inquiring why    Past Medical History:  Diagnosis Date   Family history of colon cancer    Family history of pancreatic cancer    Hypothyroidism    Osteopenia    Thyroid disease    Hyperthyroid--had iodine therapy   Urinary incontinence    with sneezing, coughing    Patient Active Problem List   Diagnosis Date Noted   Genetic testing 09/22/2023   Family history of pancreatic cancer    Family history of colon cancer     Past Surgical History:  Procedure Laterality Date   BLADDER SUSPENSION N/A 07/01/2014   Procedure:  TRANSVAGINAL TAPE (TVT) PROCEDURE,;  Surgeon: Forrestine Him Amundson de Gwenevere Ghazi, MD;  Location: [facility];  Service: Gynecology;  Laterality: N/A;   CATARACT EXTRACTION Bilateral    CYSTOSCOPY N/A 07/01/2014   Procedure: CYSTOSCOPY;  Surgeon: Jacqualin Combes de Gwenevere Ghazi, MD;  Location: [facility];  Service: Gynecology;  Laterality: N/A;   THORACIC OUTLET SURGERY     VAGINAL HYSTERECTOMY N/A 07/01/2014   Procedure: HYSTERECTOMY VAGINAL;  Surgeon: Jacqualin Combes de Gwenevere Ghazi, MD;  Location: [facility];  Service: Gynecology;  Laterality: N/A;    OB History     Gravida  2   Para  2   Term  2   Preterm      AB      Living  2      SAB      IAB      Ectopic      Multiple      Live Births               Home Medications    Prior to  Admission medications   Medication Sig Start Date End Date Taking? Authorizing Provider  sulfamethoxazole-trimethoprim (BACTRIM DS) 800-160 MG tablet Take 1 tablet by mouth 2 (two) times daily for 7 days. 09/24/23 10/01/23 Yes Eustace Moore, MD  Calcium Citrate-Vitamin D (CVS CALCIUM CITRATE + D PO) Take 600 mg by mouth 2 (two) times daily.    [provider]  Cholecalciferol (VITAMIN D-3) 1000 UNITS CAPS Take 1,000 Units by mouth daily.     [provider]  diclofenac Sodium (VOLTAREN) 1 % GEL apply left thumb 07/31/20   [provider]  estradiol (ESTRACE) 0.1 MG/GM vaginal cream Place 1/2 gram per vagina at hs 2 - 3 times per week. 05/25/23   Patton Salles, MD  Fish Oil-Cholecalciferol (FISH OIL + D3) 1000-1000 MG-UNIT CAPS Take 1,000 mg by mouth daily.    [provider]  ibandronate (BONIVA) 150 MG tablet Take 150 mg by mouth every 30 (thirty) days. 04/06/21   [provider]  levothyroxine (SYNTHROID, LEVOTHROID)  25 MCG tablet Take 25 mcg by mouth daily. 05/26/14   [provider]  Multiple Vitamin (MULTIVITAMIN WITH MINERALS) TABS tablet Take 1 tablet by mouth daily.    [provider]  tacrolimus (PROTOPIC) 0.1 % ointment Apply topically 2 (two) times daily.    [provider]    Family History Family History  Problem Relation Age of Onset   Pancreatic cancer Mother 35   Cancer Maternal Aunt 95       large intestine   Testicular cancer Maternal Uncle    Other Maternal Grandfather 28       intestien issues- unk if cancer   Cancer Maternal Uncle        leg- unk if bone or soft tissue?   Cancer Cousin        knee- bone cancer?    Social History Social History   Tobacco Use   Smoking status: Never   Smokeless tobacco: Never  Vaping Use   Vaping status: Never Used  Substance Use Topics   Alcohol use: Yes    Alcohol/week: 2.0 standard drinks of alcohol    Types: 2 Standard drinks or  equivalent per week    Comment: 1-2 drinks per month   Drug use: No     Allergies   Almond oil, Apple, Cefzil [cefprozil], Fig extract [ficus], Kiwi extract, and Peanuts [peanut oil]   Review of Systems Review of Systems  See HPI Physical Exam Triage Vital Signs ED Triage Vitals  Encounter Vitals Group     BP 09/24/23 1517 (!) 100/59     Systolic BP Percentile --      Diastolic BP Percentile --      Pulse Rate 09/24/23 1517 65     Resp 09/24/23 1517 16     Temp 09/24/23 1517 98 F (36.7 C)     Temp Source 09/24/23 1517 Oral     SpO2 09/24/23 1517 97 %     Weight --      Height --      Head Circumference --      Peak Flow --      Pain Score 09/24/23 1520 1     Pain Loc --      Pain Education --      Exclude from Growth Chart --    No data found.  Updated Vital Signs BP (!) 100/59   Pulse 65   Temp 98 F (36.7 C) (Oral)   Resp 16   LMP 02/09/2002   SpO2 97%   Physical Exam Constitutional:      General: She is not in acute distress.    Appearance: She is well-developed.  HENT:     Head: Normocephalic and atraumatic.  Eyes:     Conjunctiva/sclera: Conjunctivae normal.     Pupils: Pupils are equal, round, and reactive to light.  Cardiovascular:     Rate and Rhythm: Normal rate.  Pulmonary:     Effort: Pulmonary effort is normal. No respiratory distress.  Abdominal:     General: There is no distension.     Palpations: Abdomen is soft.     Tenderness: There is no right CVA tenderness or left CVA tenderness.  Musculoskeletal:        General: Normal range of motion.     Cervical back: Normal range of motion.  Skin:    General: Skin is warm and dry.  Neurological:     Mental Status: She is alert.  UC Treatments / Results  Labs (all labs ordered are listed, but only abnormal results are displayed) Labs Reviewed  POCT URINALYSIS DIP (MANUAL ENTRY) - Abnormal; Notable for the following components:      Result Value   Clarity, UA cloudy (*)     Spec Grav, UA <=1.005 (*)    Blood, UA trace-lysed (*)    Leukocytes, UA Moderate (2+) (*)    All other components within normal limits  URINE CULTURE    EKG   Radiology No results found.  Procedures Procedures (including critical care time)  Medications Ordered in UC Medications - No data to display  Initial Impression / Assessment and Plan / UC Course  I have reviewed the triage vital signs and the nursing notes.  Pertinent labs & imaging results that were available during my care of the patient were reviewed by me and considered in my medical decision making (see chart for details).     UTI discussed.  Etiology.  Prevention Final Clinical Impressions(s) / UC Diagnoses   Final diagnoses:  Lower urinary tract infectious disease     Discharge Instructions      Continue to drink lots of water Take the antibiotic 2 times a day Your urine sample has been sent to the laboratory.  You will be called if any change in antibiotic is indicated.  You can see your test results on MyChart Follow-up with your family doctor   ED Prescriptions     Medication Sig Dispense Auth. Provider   sulfamethoxazole-trimethoprim (BACTRIM DS) 800-160 MG tablet Take 1 tablet by mouth 2 (two) times daily for 7 days. 14 tablet Eustace Moore, MD      PDMP not reviewed this encounter.   Eustace Moore, MD 09/24/23 [phone]

## 2023-09-25 ENCOUNTER — Telehealth: Payer: Self-pay | Admitting: Genetic Counselor

## 2023-09-25 NOTE — Telephone Encounter (Signed)
I contacted Ms. Stehr to discuss her genetic testing results. No pathogenic variants were identified in the 71 genes analyzed. 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 Detailed clinic note to follow.  The test report has been scanned into EPIC and is located under the Molecular Pathology section of the Results Review tab.  A portion of the result report is included below for reference.   Lalla Brothers, MS, [facility] Genetic Counselor [email or web address] (P) [phone]

## 2023-09-26 LAB — URINE CULTURE: Culture: NO GROWTH

## 2023-09-29 ENCOUNTER — Ambulatory Visit: Payer: Self-pay | Admitting: Genetic Counselor

## 2023-09-29 ENCOUNTER — Encounter: Payer: Self-pay | Admitting: Genetic Counselor

## 2023-09-29 DIAGNOSIS — Z1379 Encounter for other screening for genetic and chromosomal anomalies: Secondary | ICD-10-CM

## 2023-09-29 NOTE — Progress Notes (Signed)
HPI:   Crystal Carr was previously seen in the [facility] clinic due to a family history of cancer and concerns regarding a hereditary predisposition to cancer. Please refer to our prior cancer genetics clinic note for more information regarding our discussion, assessment and recommendations, at the time. Crystal Carr recent genetic test results were disclosed to her, as were recommendations warranted by these results. These results and recommendations are discussed in more detail below.  CANCER HISTORY:  Oncology History   No history exists.   FAMILY HISTORY:  We obtained a detailed, 4-generation family history.  Significant diagnoses are listed below:      Family History  Problem Relation Age of Onset   Pancreatic cancer Mother 36   Cancer Maternal Aunt 95        large intestine   Testicular cancer Maternal Uncle     Other Maternal Grandfather 61        intestien issues- unk if cancer   Cancer Maternal Uncle          leg- unk if bone or soft tissue?   Cancer Cousin          knee- bone cancer?           Crystal Carr father: died at 18, no history of cancer.  Paternal Aunts/Uncles: 6 paternal aunts/uncles, no history of cancer.  Paternal cousins: 1 paternal cousin died of cancer in the knee-  unknown if bone or soft tissue Paternal grandfather: died due to trauma Paternal grandmother: no history of cancer.   Crystal Carr mother: died of pancreatic cancer dx at 28 Maternal Aunts/Uncles: 64 maternal aunts/uncles, 1 maternal aunt dx with colon cancer in her 54's,  1 maternal uncle dx with testicular cancer in his 48s, 1 maternal uncle dx with cancer in the leg- unknown if bone or soft tissue Maternal cousins: no history of cancer Maternal grandfather: died in his 53's- intestinal issues, unknown if cancer Maternal grandmother: died in her 80's with no history of cancer.   Crystal Carr is unaware of previous family history of genetic testing for  hereditary cancer risks. Patient's maternal ancestors are of [place] descent, and paternal ancestors are of [place] descent. There is no reported Ashkenazi Jewish ancestry. There is no known consanguinity.   GENETIC TEST RESULTS:  The [facility] CancerNext-Expanded Panel found no pathogenic mutations.   The CancerNext-Expanded gene panel offered by [facility] and includes sequencing, rearrangement, and RNA analysis for the following 71 genes: AIP, ALK, APC, ATM, AXIN2, BAP1, BARD1, BMPR1A, BRCA1, BRCA2, BRIP1, CDC73, CDH1, CDK4, CDKN1B, CDKN2A, CHEK2, CTNNA1, DICER1, FH, FLCN, KIF1B, LZTR1, MAX, MEN1, MET, MLH1, MSH2, MSH3, MSH6, MUTYH, NF1, NF2, NTHL1, PALB2, PHOX2B, PMS2, POT1, PRKAR1A, PTCH1, PTEN, RAD51C, RAD51D, RB1, RET, SDHA, SDHAF2, SDHB, SDHC, SDHD, SMAD4, SMARCA4, SMARCB1, SMARCE1, STK11, SUFU, TMEM127, TP53, TSC1, TSC2, and VHL (sequencing and deletion/duplication); EGFR, EGLN1, HOXB13, KIT, MITF, PDGFRA, POLD1, and POLE (sequencing only); EPCAM and GREM1 (deletion/duplication only).   The test report has been scanned into EPIC and is located under the Molecular Pathology section of the Results Review tab.  A portion of the result report is included below for reference. Genetic testing reported out on 09/11/2023.       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  At this time, it is unknown if this variant is associated with an increased risk for cancer or if it is benign, but most uncertain variants are reclassified to  benign. It should not be used to make medical management decisions. With time, we suspect the laboratory will determine the significance of this variant, if any. If the laboratory reclassifies this variant, we will attempt to contact Crystal Carr to discuss it further.   Even though a pathogenic variant was not identified, possible explanations for the cancer in the family may include: There may be no hereditary  risk for cancer in the family. The cancers in her family may be due to other genetic or environmental factors. There may be a gene mutation in one of these genes that current testing methods cannot detect, but that chance is small. There could be another gene that has not yet been discovered, or that we have not yet tested, that is responsible for the cancer diagnoses in the family.  It is also possible there is a hereditary cause for the cancer in the family that Crystal Carr did not inherit.  Therefore, it is important to remain in touch with cancer genetics in the future so that we can continue to offer Crystal Carr the most up to date genetic testing.   ADDITIONAL GENETIC TESTING:  We discussed with Crystal Carr that her genetic testing was fairly extensive. If there are genes identified to increase cancer risk that can be analyzed in the future, we would be happy to discuss and coordinate this testing at that time.    CANCER SCREENING RECOMMENDATIONS:  Crystal Carr test result is considered negative (normal).  This means that we have not identified a hereditary cause for her family history of cancer at this time.   An individual's cancer risk and medical management are not determined by genetic test results alone. Overall cancer risk assessment incorporates additional factors, including personal medical history, family history, and any available genetic information that may result in a personalized plan for cancer prevention and surveillance. Therefore, it is recommended she continue to follow the cancer management and screening guidelines provided by her primary healthcare provider.  RECOMMENDATIONS FOR FAMILY MEMBERS:   Since she did not inherit a mutation in a cancer predisposition gene included on this panel, her children could not have inherited a mutation from her in one of these genes. We do not recommend familial testing for the BRIP1 variant of uncertain significance  (VUS).  FOLLOW-UP:  Cancer genetics is a rapidly advancing field and it is possible that new genetic tests will be appropriate for her and/or her family members in the future. We encouraged her to remain in contact with cancer genetics on an annual basis so we can update her personal and family histories and let her know of advances in cancer genetics that may benefit this family.   Our contact number was provided. Crystal Carr questions were answered to her satisfaction, and she knows she is welcome to call us at anytime with additional questions or concerns.   Crystal Brothers, MS, [facility] Genetic Counselor [email or web address] (P) [phone]

## 2023-10-12 DIAGNOSIS — L2089 Other atopic dermatitis: Secondary | ICD-10-CM | POA: Diagnosis not present

## 2023-10-12 DIAGNOSIS — L820 Inflamed seborrheic keratosis: Secondary | ICD-10-CM | POA: Diagnosis not present

## 2024-02-16 DIAGNOSIS — M545 Low back pain, unspecified: Secondary | ICD-10-CM | POA: Diagnosis not present

## 2024-02-16 DIAGNOSIS — M25551 Pain in right hip: Secondary | ICD-10-CM | POA: Diagnosis not present

## 2024-02-21 DIAGNOSIS — K642 Third degree hemorrhoids: Secondary | ICD-10-CM | POA: Diagnosis not present

## 2024-03-04 ENCOUNTER — Other Ambulatory Visit: Payer: Self-pay

## 2024-03-04 DIAGNOSIS — N952 Postmenopausal atrophic vaginitis: Secondary | ICD-10-CM

## 2024-03-04 NOTE — Telephone Encounter (Signed)
 Med refill request: estrace Last AEX: 05/25/23 Next AEX: n/a- LR MCR Last MMG (if hormonal med) 06/29/23 Refill authorized: Please Advise, 342.5, 1 RF

## 2024-03-04 NOTE — Telephone Encounter (Signed)
 Need appt with Dr. Edward Jolly

## 2024-03-05 DIAGNOSIS — M545 Low back pain, unspecified: Secondary | ICD-10-CM | POA: Diagnosis not present

## 2024-03-05 DIAGNOSIS — M7601 Gluteal tendinitis, right hip: Secondary | ICD-10-CM | POA: Diagnosis not present

## 2024-03-05 NOTE — Telephone Encounter (Signed)
 Asked front to schedule AEX

## 2024-03-06 NOTE — Telephone Encounter (Signed)
 Paper rx also received today from [facility] [phone] for same medication.

## 2024-03-14 NOTE — Telephone Encounter (Signed)
 Refil denied

## 2024-03-19 DIAGNOSIS — M545 Low back pain, unspecified: Secondary | ICD-10-CM | POA: Diagnosis not present

## 2024-03-19 DIAGNOSIS — M7601 Gluteal tendinitis, right hip: Secondary | ICD-10-CM | POA: Diagnosis not present

## 2024-04-04 DIAGNOSIS — M545 Low back pain, unspecified: Secondary | ICD-10-CM | POA: Diagnosis not present

## 2024-04-04 DIAGNOSIS — M7601 Gluteal tendinitis, right hip: Secondary | ICD-10-CM | POA: Diagnosis not present

## 2024-04-15 DIAGNOSIS — K641 Second degree hemorrhoids: Secondary | ICD-10-CM | POA: Diagnosis not present

## 2024-04-15 DIAGNOSIS — K642 Third degree hemorrhoids: Secondary | ICD-10-CM | POA: Diagnosis not present

## 2024-04-15 HISTORY — PX: HEMORRHOIDECTOMY WITH HEMORRHOID BANDING: SHX5633

## 2024-05-28 ENCOUNTER — Other Ambulatory Visit: Payer: Self-pay | Admitting: Obstetrics and Gynecology

## 2024-05-28 DIAGNOSIS — Z1231 Encounter for screening mammogram for malignant neoplasm of breast: Secondary | ICD-10-CM

## 2024-06-03 NOTE — Progress Notes (Unsigned)
 GYNECOLOGY  VISIT   HPI: 73 y.o.   Divorced  Caucasian female   G2P2002 with Patient's last menstrual period was 02/09/2002.   here for: Medication check - Estrace  vaginal cream for atrophy.  Ran out a few months ago.  Considering using the vaginal estradiol  tablets.  She wants a written prescription.   Notes some dryness and tenderness.    Not having sex.  Considering intimacy.   GYNECOLOGIC HISTORY: Patient's last menstrual period was 02/09/2002. Contraception:  hyst  Menopausal hormone therapy:  Estrace  cream.   Last 2 paps:  11/19/14 ASCUS  History of abnormal Pap or positive HPV:  yes 2008 Mammogram:  06/29/23 Breast Density Cat C, BIRADS Cat 1 neg.  Has appointment.         OB History     Gravida  2   Para  2   Term  2   Preterm      AB      Living  2      SAB      IAB      Ectopic      Multiple      Live Births                 Patient Active Problem List   Diagnosis Date Noted   Genetic testing 09/22/2023   Family history of pancreatic cancer    Family history of colon cancer     Past Medical History:  Diagnosis Date   Family history of colon cancer    Family history of pancreatic cancer    Hypothyroidism    Osteopenia    Thyroid  disease    Hyperthyroid--had iodine therapy   Urinary incontinence    with sneezing, coughing    Past Surgical History:  Procedure Laterality Date   BLADDER SUSPENSION N/A 07/01/2014   Procedure:  TRANSVAGINAL TAPE (TVT) PROCEDURE,;  Surgeon: Bobie FORBES Crown de Charlynn FORBES Cary, MD;  Location: [facility];  Service: Gynecology;  Laterality: N/A;   CATARACT EXTRACTION Bilateral    CYSTOSCOPY N/A 07/01/2014   Procedure: CYSTOSCOPY;  Surgeon: Bobie FORBES Crown de Charlynn FORBES Cary, MD;  Location: [facility];  Service: Gynecology;  Laterality: N/A;   HEMORRHOIDECTOMY WITH HEMORRHOID BANDING  04/15/2024   THORACIC OUTLET SURGERY     VAGINAL HYSTERECTOMY N/A 07/01/2014   Procedure: HYSTERECTOMY VAGINAL;  Surgeon:  Bobie FORBES Crown de Charlynn FORBES Cary, MD;  Location: [facility];  Service: Gynecology;  Laterality: N/A;    Current Outpatient Medications  Medication Sig Dispense Refill   Calcium Citrate-Vitamin D ([facility] CALCIUM CITRATE + D PO) Take 600 mg by mouth 2 (two) times daily.     Cholecalciferol (VITAMIN D-3) 1000 UNITS CAPS Take 1,000 Units by mouth daily.      diclofenac Sodium (VOLTAREN) 1 % GEL apply left thumb     [START ON 06/06/2024] Estradiol  (VAGIFEM ) 10 MCG TABS vaginal tablet Place 1 tablet (10 mcg total) vaginally 2 (two) times a week. Use every night before bed for two weeks when you first begin this medicine, then after the first two weeks, begin using it twice a week. 34 tablet 3   Fish Oil-Cholecalciferol (FISH OIL + D3) 1000-1000 MG-UNIT CAPS Take 1,000 mg by mouth daily.     ibandronate (BONIVA) 150 MG tablet Take 150 mg by mouth every 30 (thirty) days.     levothyroxine  (SYNTHROID , LEVOTHROID) 25 MCG tablet Take 25 mcg by mouth daily.     Multiple Vitamin (MULTIVITAMIN  WITH MINERALS) TABS tablet Take 1 tablet by mouth daily.     tacrolimus (PROTOPIC) 0.1 % ointment Apply topically 2 (two) times daily.     No current facility-administered medications for this visit.     ALLERGIES: Almond oil, Apple, Cefzil [cefprozil], Fig extract [ficus], Kiwi extract, and Peanuts [peanut oil]  Family History  Problem Relation Age of Onset   Pancreatic cancer Mother 32   Cancer Maternal Aunt 95       large intestine   Testicular cancer Maternal Uncle    Other Maternal Grandfather 9       intestien issues- unk if cancer   Cancer Maternal Uncle        leg- unk if bone or soft tissue?   Cancer Cousin        knee- bone cancer?    Social History   Socioeconomic History   Marital status: Divorced    Spouse name: Not on file   Number of children: Not on file   Years of education: Not on file   Highest education level: Not on file  Occupational History   Not on file  Tobacco Use   Smoking  status: Never   Smokeless tobacco: Never  Vaping Use   Vaping status: Never Used  Substance and Sexual Activity   Alcohol use: Yes    Alcohol/week: 2.0 standard drinks of alcohol    Types: 2 Standard drinks or equivalent per week    Comment: 1-2 drinks per month   Drug use: No   Sexual activity: Not Currently    Partners: Male    Birth control/protection: Post-menopausal, Surgical    Comment: TVH, first intercourse >53 y.o.  Other Topics Concern   Not on file  Social History Narrative   Not on file   Social Drivers of Health   Financial Resource Strain: Not on file  Food Insecurity: Not on file  Transportation Needs: Not on file  Physical Activity: Not on file  Stress: Not on file  Social Connections: Not on file  Intimate Partner Violence: Not on file    Review of Systems  All other systems reviewed and are negative.   PHYSICAL EXAMINATION:   BP 108/68 (BP Location: Right Arm, Patient Position: Sitting)   Pulse 71   Ht 5' 4 (1.626 m)   Wt 119 lb (54 kg)   LMP 02/09/2002   SpO2 98%   BMI 20.43 kg/m     General appearance: alert, cooperative and appears stated age Head: Normocephalic, without obvious abnormality, atraumatic Neck: no adenopathy, supple, symmetrical, trachea midline and thyroid  normal to inspection and palpation Lungs: clear to auscultation bilaterally Breasts: normal appearance, no masses or tenderness, No nipple retraction or dimpling, No nipple discharge or bleeding, No axillary or supraclavicular adenopathy Heart: regular rate and rhythm Abdomen: soft, non-tender, no masses,  no organomegaly Extremities: extremities normal, atraumatic, no cyanosis or edema Skin: Skin color, texture, turgor normal. No rashes or lesions Lymph nodes: Cervical, supraclavicular, and axillary nodes normal. No abnormal inguinal nodes palpated Neurologic: Grossly normal  Pelvic: External genitalia:  no lesions              Urethra:  normal appearing urethra with no  masses, tenderness or lesions              Bartholins and Skenes: normal                 Vagina: normal appearing vagina with normal color and discharge, no lesions  Cervix: absent                Bimanual Exam:  Uterus:  absent              Adnexa: no mass, fullness, tenderness           Chaperone was present for exam:  Kari HERO, CMA  ASSESSMENT:  Status post TVH/Anterior and Posterior Colporrhaphy, TVT/cysto.  Ovaries remain.   Vaginal atrophy.  Using vaginal estrogen cream.  FH pancreatic cancer.  Has variant of unknown significance. Status post hemorrhoidectomy.   PLAN:  Printed Rx for Vagifem .  Instructed in use. I discussed potential effect on breast cancer.  Mammogram recommended yearly.  Selft breast exam reviewed.  Follow up yearly and prn.   ***  total time was spent for this patient encounter, including preparation, face-to-face counseling with the patient, coordination of care, and documentation of the encounter.

## 2024-06-04 ENCOUNTER — Ambulatory Visit (INDEPENDENT_AMBULATORY_CARE_PROVIDER_SITE_OTHER): Admitting: Obstetrics and Gynecology

## 2024-06-04 ENCOUNTER — Encounter: Payer: Self-pay | Admitting: Obstetrics and Gynecology

## 2024-06-04 VITALS — BP 108/68 | HR 71 | Ht 64.0 in | Wt 119.0 lb

## 2024-06-04 DIAGNOSIS — N952 Postmenopausal atrophic vaginitis: Secondary | ICD-10-CM | POA: Diagnosis not present

## 2024-06-04 DIAGNOSIS — Z5181 Encounter for therapeutic drug level monitoring: Secondary | ICD-10-CM

## 2024-06-04 MED ORDER — ESTRADIOL 10 MCG VA TABS: 1.0000 | ORAL_TABLET | VAGINAL | 3 refills | Status: DC

## 2024-07-01 ENCOUNTER — Ambulatory Visit
Admission: RE | Admit: 2024-07-01 | Discharge: 2024-07-01 | Disposition: A | Discharge status: Home / Self Care | Source: Ambulatory Visit | Attending: Obstetrics and Gynecology | Admitting: Obstetrics and Gynecology

## 2024-07-01 DIAGNOSIS — Z1231 Encounter for screening mammogram for malignant neoplasm of breast: Secondary | ICD-10-CM | POA: Diagnosis not present

## 2024-08-21 ENCOUNTER — Telehealth: Payer: Self-pay

## 2024-08-21 MED ORDER — ESTRADIOL 0.1 MG/GM VA CREA: TOPICAL_CREAM | VAGINAL | 2 refills | Status: DC

## 2024-08-21 NOTE — Telephone Encounter (Signed)
 Med refill request:  estradiol  0.01% cream Disp. #42.5 gm with 1 refill Last filled: 11/08/23 Last AEX: 05/25/23 Next AEX: 05/27/25 Last MMG (if hormonal med): 07/01/24  Refill authorized? Please Advise.

## 2024-08-21 NOTE — Telephone Encounter (Signed)
 Vaginal estradiol  prescription sent in to the patient's pharmacy.

## 2024-08-22 ENCOUNTER — Other Ambulatory Visit: Payer: Self-pay

## 2024-08-22 NOTE — Telephone Encounter (Signed)
 Med refill request:  estradiol  0.01% cream Disp: #42.5 gm with 1 refill Last filled: 11/08/23  Last AEX:  05/25/23 Last OV: 06/04/24 Next AEX: 05/27/25 Last MMG (if hormonal med): 07/01/24 Refill authorized? Please Advise.

## 2024-08-22 NOTE — Telephone Encounter (Signed)
 Med refill request:estradiol  vaginal tablet Last AEX: 05/25/23 BS Next AEX: 05/27/25 BS Last MMG (if hormonal med) 07/01/24 Refill authorized: medication was sent to [facility] on 08/21/24, pt called refill voicemail and stated that she would like to filled at [facility]. I have called and cancelled the Rx at [facility]. Please approve or deny.

## 2024-08-23 NOTE — Telephone Encounter (Signed)
 Patient returned call and left message stating she is requesting refill of estradiol  vaginal TABLET to [facility].

## 2024-08-24 MED ORDER — ESTRADIOL 10 MCG VA TABS: 1.0000 | ORAL_TABLET | VAGINAL | 2 refills | Status: AC

## 2024-09-03 DIAGNOSIS — Z79899 Other long term (current) drug therapy: Secondary | ICD-10-CM | POA: Diagnosis not present

## 2024-09-03 DIAGNOSIS — R42 Dizziness and giddiness: Secondary | ICD-10-CM | POA: Diagnosis not present

## 2024-09-03 DIAGNOSIS — M810 Age-related osteoporosis without current pathological fracture: Secondary | ICD-10-CM | POA: Diagnosis not present

## 2024-09-03 DIAGNOSIS — Z1331 Encounter for screening for depression: Secondary | ICD-10-CM | POA: Diagnosis not present

## 2024-09-03 DIAGNOSIS — Z23 Encounter for immunization: Secondary | ICD-10-CM | POA: Diagnosis not present

## 2024-09-03 DIAGNOSIS — E039 Hypothyroidism, unspecified: Secondary | ICD-10-CM | POA: Diagnosis not present

## 2024-09-03 DIAGNOSIS — E7800 Pure hypercholesterolemia, unspecified: Secondary | ICD-10-CM | POA: Diagnosis not present

## 2024-09-03 DIAGNOSIS — R143 Flatulence: Secondary | ICD-10-CM | POA: Diagnosis not present

## 2024-09-03 DIAGNOSIS — Z0000 Encounter for general adult medical examination without abnormal findings: Secondary | ICD-10-CM | POA: Diagnosis not present

## 2024-09-19 DIAGNOSIS — R42 Dizziness and giddiness: Secondary | ICD-10-CM | POA: Diagnosis not present

## 2024-09-23 DIAGNOSIS — R42 Dizziness and giddiness: Secondary | ICD-10-CM | POA: Diagnosis not present

## 2024-10-09 ENCOUNTER — Encounter: Admitting: Obstetrics and Gynecology

## 2024-10-14 DIAGNOSIS — L2089 Other atopic dermatitis: Secondary | ICD-10-CM | POA: Diagnosis not present

## 2024-10-14 DIAGNOSIS — L820 Inflamed seborrheic keratosis: Secondary | ICD-10-CM | POA: Diagnosis not present

## 2024-10-17 ENCOUNTER — Other Ambulatory Visit (HOSPITAL_BASED_OUTPATIENT_CLINIC_OR_DEPARTMENT_OTHER): Payer: Self-pay | Admitting: Internal Medicine

## 2024-10-17 DIAGNOSIS — M810 Age-related osteoporosis without current pathological fracture: Secondary | ICD-10-CM

## 2024-10-28 DIAGNOSIS — R682 Dry mouth, unspecified: Secondary | ICD-10-CM | POA: Diagnosis not present

## 2024-10-28 DIAGNOSIS — R0789 Other chest pain: Secondary | ICD-10-CM | POA: Diagnosis not present

## 2024-11-11 DIAGNOSIS — R350 Frequency of micturition: Secondary | ICD-10-CM | POA: Diagnosis not present

## 2024-11-11 DIAGNOSIS — Z6821 Body mass index (BMI) 21.0-21.9, adult: Secondary | ICD-10-CM | POA: Diagnosis not present

## 2024-11-15 ENCOUNTER — Ambulatory Visit

## 2024-11-15 DIAGNOSIS — M810 Age-related osteoporosis without current pathological fracture: Secondary | ICD-10-CM

## 2024-12-18 ENCOUNTER — Other Ambulatory Visit

## 2025-04-15 ENCOUNTER — Other Ambulatory Visit (HOSPITAL_BASED_OUTPATIENT_CLINIC_OR_DEPARTMENT_OTHER)

## 2025-05-27 ENCOUNTER — Encounter: Admitting: Obstetrics and Gynecology
